# Patient Record
Sex: Female | Born: 1975 | Race: White | Hispanic: No | Marital: Married | Smoking: Former smoker
Health system: Southern US, Community
[De-identification: ages and names within clinical notes are randomized; demographics above are authoritative.]

## PROBLEM LIST (undated history)

## (undated) DIAGNOSIS — F329 Major depressive disorder, single episode, unspecified: Secondary | ICD-10-CM

## (undated) DIAGNOSIS — F32A Depression, unspecified: Secondary | ICD-10-CM

## (undated) DIAGNOSIS — F419 Anxiety disorder, unspecified: Secondary | ICD-10-CM

## (undated) DIAGNOSIS — K219 Gastro-esophageal reflux disease without esophagitis: Secondary | ICD-10-CM

## (undated) HISTORY — DX: Gastro-esophageal reflux disease without esophagitis: K21.9

## (undated) HISTORY — PX: TUBAL LIGATION: SHX77

---

## 2001-03-02 ENCOUNTER — Emergency Department (HOSPITAL_COMMUNITY): Admission: EM | Admit: 2001-03-02 | Discharge: 2001-03-03 | Payer: Self-pay | Admitting: *Deleted

## 2002-12-08 ENCOUNTER — Emergency Department (HOSPITAL_COMMUNITY): Admission: EM | Admit: 2002-12-08 | Discharge: 2002-12-08 | Payer: Self-pay | Admitting: Emergency Medicine

## 2003-03-10 ENCOUNTER — Emergency Department (HOSPITAL_COMMUNITY): Admission: EM | Admit: 2003-03-10 | Discharge: 2003-03-10 | Payer: Self-pay | Admitting: Emergency Medicine

## 2003-03-10 ENCOUNTER — Encounter: Payer: Self-pay | Admitting: *Deleted

## 2004-08-12 ENCOUNTER — Emergency Department (HOSPITAL_COMMUNITY): Admission: EM | Admit: 2004-08-12 | Discharge: 2004-08-12 | Payer: Self-pay | Admitting: Emergency Medicine

## 2004-11-24 ENCOUNTER — Emergency Department (HOSPITAL_COMMUNITY): Admission: EM | Admit: 2004-11-24 | Discharge: 2004-11-24 | Payer: Self-pay | Admitting: Emergency Medicine

## 2005-02-23 ENCOUNTER — Emergency Department (HOSPITAL_COMMUNITY): Admission: EM | Admit: 2005-02-23 | Discharge: 2005-02-23 | Payer: Self-pay | Admitting: Emergency Medicine

## 2005-06-02 ENCOUNTER — Emergency Department (HOSPITAL_COMMUNITY): Admission: EM | Admit: 2005-06-02 | Discharge: 2005-06-02 | Payer: Self-pay | Admitting: Emergency Medicine

## 2006-01-14 ENCOUNTER — Emergency Department (HOSPITAL_COMMUNITY): Admission: EM | Admit: 2006-01-14 | Discharge: 2006-01-14 | Payer: Self-pay | Admitting: Emergency Medicine

## 2006-06-02 ENCOUNTER — Emergency Department (HOSPITAL_COMMUNITY): Admission: EM | Admit: 2006-06-02 | Discharge: 2006-06-02 | Payer: Self-pay | Admitting: Emergency Medicine

## 2006-10-23 ENCOUNTER — Emergency Department (HOSPITAL_COMMUNITY): Admission: EM | Admit: 2006-10-23 | Discharge: 2006-10-23 | Payer: Self-pay | Admitting: Emergency Medicine

## 2006-11-12 ENCOUNTER — Emergency Department (HOSPITAL_COMMUNITY): Admission: EM | Admit: 2006-11-12 | Discharge: 2006-11-13 | Payer: Self-pay | Admitting: Emergency Medicine

## 2006-12-26 ENCOUNTER — Emergency Department (HOSPITAL_COMMUNITY): Admission: EM | Admit: 2006-12-26 | Discharge: 2006-12-27 | Payer: Self-pay | Admitting: Emergency Medicine

## 2007-01-17 ENCOUNTER — Emergency Department (HOSPITAL_COMMUNITY): Admission: EM | Admit: 2007-01-17 | Discharge: 2007-01-17 | Payer: Self-pay | Admitting: Emergency Medicine

## 2007-01-23 ENCOUNTER — Emergency Department (HOSPITAL_COMMUNITY): Admission: EM | Admit: 2007-01-23 | Discharge: 2007-01-23 | Payer: Self-pay | Admitting: Emergency Medicine

## 2007-07-05 ENCOUNTER — Emergency Department (HOSPITAL_COMMUNITY): Admission: EM | Admit: 2007-07-05 | Discharge: 2007-07-06 | Payer: Self-pay | Admitting: Emergency Medicine

## 2007-08-09 ENCOUNTER — Emergency Department (HOSPITAL_COMMUNITY): Admission: EM | Admit: 2007-08-09 | Discharge: 2007-08-09 | Payer: Self-pay | Admitting: Emergency Medicine

## 2007-08-31 ENCOUNTER — Emergency Department (HOSPITAL_COMMUNITY): Admission: EM | Admit: 2007-08-31 | Discharge: 2007-08-31 | Payer: Self-pay | Admitting: Emergency Medicine

## 2007-09-30 ENCOUNTER — Emergency Department (HOSPITAL_COMMUNITY): Admission: EM | Admit: 2007-09-30 | Discharge: 2007-09-30 | Payer: Self-pay | Admitting: Internal Medicine

## 2007-10-04 ENCOUNTER — Emergency Department (HOSPITAL_COMMUNITY): Admission: EM | Admit: 2007-10-04 | Discharge: 2007-10-04 | Payer: Self-pay | Admitting: Emergency Medicine

## 2007-10-31 ENCOUNTER — Emergency Department (HOSPITAL_COMMUNITY): Admission: EM | Admit: 2007-10-31 | Discharge: 2007-11-01 | Payer: Self-pay | Admitting: Emergency Medicine

## 2008-03-21 ENCOUNTER — Emergency Department (HOSPITAL_COMMUNITY): Admission: EM | Admit: 2008-03-21 | Discharge: 2008-03-21 | Payer: Self-pay | Admitting: Emergency Medicine

## 2008-05-04 ENCOUNTER — Emergency Department (HOSPITAL_COMMUNITY): Admission: EM | Admit: 2008-05-04 | Discharge: 2008-05-05 | Payer: Self-pay | Admitting: Emergency Medicine

## 2008-05-27 ENCOUNTER — Inpatient Hospital Stay (HOSPITAL_COMMUNITY): Admission: EM | Admit: 2008-05-27 | Discharge: 2008-05-30 | Payer: Self-pay | Admitting: Emergency Medicine

## 2008-06-14 ENCOUNTER — Emergency Department (HOSPITAL_COMMUNITY): Admission: EM | Admit: 2008-06-14 | Discharge: 2008-06-14 | Payer: Self-pay | Admitting: Emergency Medicine

## 2008-08-25 ENCOUNTER — Emergency Department (HOSPITAL_COMMUNITY): Admission: EM | Admit: 2008-08-25 | Discharge: 2008-08-25 | Payer: Self-pay | Admitting: Emergency Medicine

## 2008-08-26 ENCOUNTER — Inpatient Hospital Stay (HOSPITAL_COMMUNITY): Admission: EM | Admit: 2008-08-26 | Discharge: 2008-09-11 | Payer: Self-pay | Admitting: Psychiatry

## 2008-08-26 ENCOUNTER — Ambulatory Visit: Payer: Self-pay | Admitting: Psychiatry

## 2008-11-12 ENCOUNTER — Emergency Department (HOSPITAL_COMMUNITY): Admission: EM | Admit: 2008-11-12 | Discharge: 2008-11-14 | Payer: Self-pay | Admitting: Emergency Medicine

## 2008-11-13 ENCOUNTER — Ambulatory Visit: Payer: Self-pay | Admitting: Psychiatry

## 2008-11-14 ENCOUNTER — Inpatient Hospital Stay (HOSPITAL_COMMUNITY): Admission: AD | Admit: 2008-11-14 | Discharge: 2008-11-17 | Payer: Self-pay | Admitting: Psychiatry

## 2009-03-09 ENCOUNTER — Emergency Department (HOSPITAL_COMMUNITY): Admission: EM | Admit: 2009-03-09 | Discharge: 2009-03-09 | Payer: Self-pay | Admitting: Emergency Medicine
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
## 2010-10-12 LAB — DIFFERENTIAL
Eosinophils Relative: 1 % (ref 0–5)
Lymphocytes Relative: 29 % (ref 12–46)
Monocytes Relative: 8 % (ref 3–12)
Neutrophils Relative %: 61 % (ref 43–77)

## 2010-10-12 LAB — URINALYSIS, ROUTINE W REFLEX MICROSCOPIC
Bilirubin Urine: NEGATIVE
Glucose, UA: NEGATIVE mg/dL
Hgb urine dipstick: NEGATIVE
Nitrite: NEGATIVE
Specific Gravity, Urine: 1.01 (ref 1.005–1.030)
Urobilinogen, UA: 0.2 mg/dL (ref 0.0–1.0)
pH: 7 (ref 5.0–8.0)

## 2010-10-12 LAB — GC/CHLAMYDIA PROBE AMP, GENITAL
Chlamydia, DNA Probe: NEGATIVE
GC Probe Amp, Genital: NEGATIVE

## 2010-10-12 LAB — PREGNANCY, URINE: Preg Test, Ur: NEGATIVE

## 2010-10-12 LAB — WET PREP, GENITAL: Clue Cells Wet Prep HPF POC: NONE SEEN

## 2010-10-12 LAB — CBC
Hemoglobin: 13.8 g/dL (ref 12.0–15.0)
MCHC: 34.3 g/dL (ref 30.0–36.0)
MCV: 96.4 fL (ref 78.0–100.0)
RBC: 4.16 MIL/uL (ref 3.87–5.11)
RDW: 13 % (ref 11.5–15.5)
WBC: 7.8 10*3/uL (ref 4.0–10.5)

## 2010-10-12 LAB — BASIC METABOLIC PANEL
Chloride: 110 mEq/L (ref 96–112)
GFR calc Af Amer: >60 mL/min (ref >60)
Glucose, Bld: 90 mg/dL (ref 70–99)
Sodium: 139 mEq/L (ref 135–145)

## 2010-10-16 LAB — RAPID URINE DRUG SCREEN, HOSP PERFORMED
Amphetamines: NOT DETECTED
Opiates: POSITIVE — AB
Tetrahydrocannabinol: POSITIVE — AB

## 2010-10-16 LAB — BASIC METABOLIC PANEL
CO2: 27 mEq/L (ref 19–32)
Chloride: 101 mEq/L (ref 96–112)
GFR calc Af Amer: >60 mL/min (ref >60)
GFR calc non Af Amer: >60 mL/min (ref >60)
Glucose, Bld: 108 mg/dL — ABNORMAL HIGH (ref 70–99)
Potassium: 3.4 mEq/L — ABNORMAL LOW (ref 3.5–5.1)
Sodium: 134 mEq/L — ABNORMAL LOW (ref 135–145)

## 2010-10-16 LAB — CBC
Hemoglobin: 13.2 g/dL (ref 12.0–15.0)
MCHC: 34.1 g/dL (ref 30.0–36.0)
MCV: 97.4 fL (ref 78.0–100.0)
RDW: 13.8 % (ref 11.5–15.5)

## 2010-10-16 LAB — RPR: RPR Ser Ql: NONREACTIVE

## 2010-10-16 LAB — DIFFERENTIAL
Eosinophils Absolute: 0 10*3/uL (ref 0.0–0.7)
Eosinophils Relative: 0 % (ref 0–5)
Lymphocytes Relative: 25 % (ref 12–46)
Lymphs Abs: 2.7 10*3/uL (ref 0.7–4.0)
Monocytes Absolute: 0.9 10*3/uL (ref 0.1–1.0)
Monocytes Relative: 9 % (ref 3–12)
Neutro Abs: 7.3 10*3/uL (ref 1.7–7.7)

## 2010-10-16 LAB — HEPATIC FUNCTION PANEL
Albumin: 3.7 g/dL (ref 3.5–5.2)
Alkaline Phosphatase: 71 U/L (ref 39–117)
Total Bilirubin: 0.3 mg/dL (ref 0.3–1.2)

## 2010-10-23 LAB — BASIC METABOLIC PANEL
BUN: 4 mg/dL — ABNORMAL LOW (ref 6–23)
CO2: 26 mEq/L (ref 19–32)
GFR calc non Af Amer: >60 mL/min (ref >60)
Glucose, Bld: 103 mg/dL — ABNORMAL HIGH (ref 70–99)
Potassium: 3.2 mEq/L — ABNORMAL LOW (ref 3.5–5.1)

## 2010-10-23 LAB — CBC
HCT: 38 % (ref 36.0–46.0)
MCHC: 34.3 g/dL (ref 30.0–36.0)
MCV: 95.7 fL (ref 78.0–100.0)
Platelets: 343 10*3/uL (ref 150–400)
RDW: 13.3 % (ref 11.5–15.5)

## 2010-10-23 LAB — RAPID URINE DRUG SCREEN, HOSP PERFORMED: Tetrahydrocannabinol: POSITIVE — AB

## 2010-10-23 LAB — URINE MICROSCOPIC-ADD ON

## 2010-10-23 LAB — URINALYSIS, ROUTINE W REFLEX MICROSCOPIC
Leukocytes, UA: NEGATIVE
Nitrite: POSITIVE — AB
Specific Gravity, Urine: 1.01 (ref 1.005–1.030)
Urobilinogen, UA: 0.2 mg/dL (ref 0.0–1.0)
pH: 6.5 (ref 5.0–8.0)

## 2010-10-23 LAB — DIFFERENTIAL
Basophils Absolute: 0.1 10*3/uL (ref 0.0–0.1)
Basophils Relative: 1 % (ref 0–1)
Eosinophils Absolute: 0 10*3/uL (ref 0.0–0.7)
Eosinophils Relative: 0 % (ref 0–5)
Lymphocytes Relative: 28 % (ref 12–46)

## 2010-10-23 LAB — PREGNANCY, URINE: Preg Test, Ur: NEGATIVE

## 2010-10-23 LAB — PHENOBARBITAL LEVEL: Phenobarbital: <5 ug/mL — ABNORMAL LOW (ref 15.0–40.0)
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
## 2011-03-29 LAB — DIFFERENTIAL
Basophils Absolute: 0
Eosinophils Relative: 1
Lymphocytes Relative: 29
Lymphs Abs: 2
Neutro Abs: 4.4
Neutrophils Relative %: 62

## 2011-03-29 LAB — BASIC METABOLIC PANEL
BUN: 8
Calcium: 9.1
Creatinine, Ser: 0.6
GFR calc non Af Amer: >60
Glucose, Bld: 105 — ABNORMAL HIGH
Potassium: 3.4 — ABNORMAL LOW

## 2011-03-29 LAB — URINALYSIS, ROUTINE W REFLEX MICROSCOPIC
Bilirubin Urine: NEGATIVE
Glucose, UA: NEGATIVE
Hgb urine dipstick: NEGATIVE
Ketones, ur: NEGATIVE
Nitrite: NEGATIVE
Protein, ur: NEGATIVE
Urobilinogen, UA: 0.2
pH: 6.5
pH: 6.5

## 2011-03-29 LAB — URINE MICROSCOPIC-ADD ON

## 2011-03-29 LAB — CBC
HCT: 36.3
Platelets: 266
RDW: 13.4
WBC: 7

## 2011-03-29 LAB — PREGNANCY, URINE: Preg Test, Ur: NEGATIVE

## 2011-04-01 LAB — URINALYSIS, ROUTINE W REFLEX MICROSCOPIC
Bilirubin Urine: NEGATIVE
Nitrite: NEGATIVE
Specific Gravity, Urine: <1.005 — ABNORMAL LOW
Urobilinogen, UA: 0.2
pH: 7.5

## 2011-04-01 LAB — PREGNANCY, URINE: Preg Test, Ur: NEGATIVE

## 2011-04-02 LAB — RAPID URINE DRUG SCREEN, HOSP PERFORMED
Amphetamines: NOT DETECTED
Barbiturates: NOT DETECTED
Cocaine: NOT DETECTED
Opiates: NOT DETECTED

## 2011-04-02 LAB — DIFFERENTIAL
Basophils Absolute: 0
Basophils Relative: 1
Lymphocytes Relative: 46
Neutro Abs: 4.1
Neutrophils Relative %: 47

## 2011-04-02 LAB — URINALYSIS, ROUTINE W REFLEX MICROSCOPIC
Nitrite: NEGATIVE
Specific Gravity, Urine: <1.005 — ABNORMAL LOW
Urobilinogen, UA: 0.2
pH: 5.5

## 2011-04-02 LAB — BASIC METABOLIC PANEL
CO2: 20
Calcium: 9.2
Creatinine, Ser: 0.59
GFR calc Af Amer: >60
GFR calc non Af Amer: >60
Sodium: 141

## 2011-04-02 LAB — CBC
MCHC: 34.9
RBC: 4.02
RDW: 13.6

## 2011-04-08 LAB — URINE CULTURE: Colony Count: >=100000

## 2011-04-08 LAB — URINALYSIS, ROUTINE W REFLEX MICROSCOPIC
Bilirubin Urine: NEGATIVE
Glucose, UA: NEGATIVE
Hgb urine dipstick: NEGATIVE
Ketones, ur: NEGATIVE
Nitrite: NEGATIVE
Protein, ur: NEGATIVE
Specific Gravity, Urine: 1.01
Urobilinogen, UA: 0.2
pH: 7

## 2011-04-08 LAB — RAPID URINE DRUG SCREEN, HOSP PERFORMED
Amphetamines: NOT DETECTED
Barbiturates: NOT DETECTED
Benzodiazepines: POSITIVE — AB
Cocaine: POSITIVE — AB
Opiates: POSITIVE — AB
Tetrahydrocannabinol: POSITIVE — AB

## 2011-04-08 LAB — URINE MICROSCOPIC-ADD ON

## 2011-04-08 LAB — PREGNANCY, URINE: Preg Test, Ur: NEGATIVE

## 2011-04-09 LAB — PROTIME-INR
INR: 1
Prothrombin Time: 13.2

## 2011-04-09 LAB — POCT I-STAT, CHEM 8
Calcium, Ion: 1.13
Chloride: 109
Glucose, Bld: 92
HCT: 40
TCO2: 22

## 2011-04-09 LAB — CBC
Hemoglobin: 13.4
MCHC: 33.2
Platelets: 322
RDW: 13.7

## 2011-04-23 LAB — DIFFERENTIAL
Basophils Absolute: 0
Lymphocytes Relative: 29
Neutro Abs: 5.8

## 2011-04-23 LAB — URINALYSIS, ROUTINE W REFLEX MICROSCOPIC
Nitrite: NEGATIVE
Specific Gravity, Urine: 1.01
Urobilinogen, UA: 0.2

## 2011-04-23 LAB — I-STAT 8, (EC8 V) (CONVERTED LAB)
Acid-base deficit: 3 — ABNORMAL HIGH
Bicarbonate: 20.3
Chloride: 108
pCO2, Ven: 30.7 — ABNORMAL LOW
pH, Ven: 7.43 — ABNORMAL HIGH

## 2011-04-23 LAB — CBC
HCT: 41.6
MCV: 95.4
Platelets: 341
RBC: 4.36
WBC: 9.1

## 2011-04-23 LAB — COMPREHENSIVE METABOLIC PANEL
BUN: 4 — ABNORMAL LOW
CO2: 23
Chloride: 107
Creatinine, Ser: 0.56
GFR calc non Af Amer: >60
Total Bilirubin: 0.5

## 2011-04-23 LAB — URINE CULTURE

## 2011-04-23 LAB — URINE MICROSCOPIC-ADD ON

## 2011-04-23 LAB — LIPASE, BLOOD: Lipase: 26

## 2011-04-23 LAB — POCT PREGNANCY, URINE: Preg Test, Ur: NEGATIVE

## 2011-04-24 LAB — BASIC METABOLIC PANEL
Calcium: 8.7
Creatinine, Ser: 0.62
GFR calc non Af Amer: >60
Glucose, Bld: 80
Sodium: 136

## 2011-04-24 LAB — URINE MICROSCOPIC-ADD ON

## 2011-04-24 LAB — DIFFERENTIAL
Basophils Absolute: 0.1
Lymphocytes Relative: 19
Monocytes Absolute: 1 — ABNORMAL HIGH
Neutro Abs: 7
Neutrophils Relative %: 70

## 2011-04-24 LAB — CBC
Hemoglobin: 13.3
Platelets: 289
RDW: 13.6

## 2011-04-24 LAB — URINALYSIS, ROUTINE W REFLEX MICROSCOPIC
Nitrite: POSITIVE — AB
Specific Gravity, Urine: 1.025
Urobilinogen, UA: 0.2
pH: 6

## 2011-07-01 ENCOUNTER — Encounter: Payer: Self-pay | Admitting: *Deleted

## 2011-07-01 ENCOUNTER — Emergency Department (HOSPITAL_COMMUNITY)
Admission: EM | Admit: 2011-07-01 | Discharge: 2011-07-01 | Disposition: A | Discharge status: Home / Self Care | Payer: Self-pay | Attending: Emergency Medicine | Admitting: Emergency Medicine

## 2011-07-01 DIAGNOSIS — G479 Sleep disorder, unspecified: Secondary | ICD-10-CM | POA: Insufficient documentation

## 2011-07-01 DIAGNOSIS — H16009 Unspecified corneal ulcer, unspecified eye: Secondary | ICD-10-CM | POA: Insufficient documentation

## 2011-07-01 DIAGNOSIS — F411 Generalized anxiety disorder: Secondary | ICD-10-CM | POA: Insufficient documentation

## 2011-07-01 DIAGNOSIS — Z79899 Other long term (current) drug therapy: Secondary | ICD-10-CM | POA: Insufficient documentation

## 2011-07-01 DIAGNOSIS — R0602 Shortness of breath: Secondary | ICD-10-CM | POA: Insufficient documentation

## 2011-07-01 DIAGNOSIS — F419 Anxiety disorder, unspecified: Secondary | ICD-10-CM

## 2011-07-01 MED ORDER — ALPRAZOLAM 0.25 MG PO TABS: 0.2500 mg | ORAL_TABLET | Freq: Three times a day (TID) | ORAL | Status: AC | PRN

## 2011-07-01 MED ORDER — HYDROCODONE-ACETAMINOPHEN 5-325 MG PO TABS: 1.0000 | ORAL_TABLET | ORAL | Status: AC | PRN

## 2011-07-01 MED ORDER — ALPRAZOLAM 0.5 MG PO TABS
0.2500 mg | ORAL_TABLET | Freq: Once | ORAL | Status: AC
  Administered 2011-07-01: 0.25 mg via ORAL
  Filled 2011-07-01: qty 1

## 2011-07-01 MED ORDER — FLUORESCEIN SODIUM 1 MG OP STRP
1.0000 | ORAL_STRIP | Freq: Once | OPHTHALMIC | Status: AC
Start: 2011-07-01 — End: 2011-07-01
  Administered 2011-07-01: 1 via OPHTHALMIC
  Filled 2011-07-01: qty 1

## 2011-07-01 MED ORDER — TETRACAINE HCL 0.5 % OP SOLN
2.0000 [drp] | Freq: Once | OPHTHALMIC | Status: AC
  Administered 2011-07-01: 2 [drp] via OPHTHALMIC
  Filled 2011-07-01: qty 2

## 2011-07-01 MED ORDER — CIPROFLOXACIN HCL 0.3 % OP SOLN: OPHTHALMIC | Status: DC

## 2011-07-01 NOTE — ED Notes (Signed)
Pt states she is having a panic attack with cp and sob pt also states she feels like her muscles are falling off her body.

## 2011-07-01 NOTE — ED Notes (Addendum)
Pt upset because her and her husband separated after 21 yrs. He left her 2 months behind on the bills and has lost 20lbs. Pt feels sob, like she is having a panic attack. Pt states she had a panic attack at work and had her cousin bring her here. Pt also c/o left eye pain.

## 2011-07-02 NOTE — ED Provider Notes (Signed)
History     CSN: [id number]  Arrival date & time 07/01/11  4098   First MD Initiated Contact with Patient 07/01/11 [phone]      Chief Complaint  Patient presents with  . Shortness of Breath  . Panic Attack  . Eye Problem    (Consider location/radiation/quality/duration/timing/severity/associated sxs/prior treatment) Patient is a 35 y.o. female presenting with eye problem and anxiety. The history is provided by the patient.  Eye Problem  This is a new problem. The current episode started yesterday. The problem occurs constantly. The problem has not changed since onset.There is pain in the left eye. The injury mechanism was contact lenses. The pain is at a severity of 9/10. The pain is moderate. There is no history of trauma to the eye. There is no known exposure to pink eye. She wears contacts. Associated symptoms include photophobia and eye redness. Pertinent negatives include no numbness, no decreased vision, no discharge, no foreign body sensation, no nausea, no vomiting and no weakness. Associated symptoms comments: She took her contacts out yesterday when her eye started getting red and irritated.. She has tried nothing for the symptoms.  Anxiety This is a new problem. The current episode started today. The problem occurs every several days. The problem has been unchanged. Pertinent negatives include no abdominal pain, arthralgias, chest pain, congestion, fever, headaches, joint swelling, nausea, neck pain, numbness, rash, sore throat, vomiting or weakness. Associated symptoms comments: She reports decreased sleep and appetite,  With increasing anxiety and several escalating episodes of anxiety and panic attack since she and her husband separated last month.  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  She became sob,  Hyperventilated and could not stop crying.  Her cousin brought her in here for assistance.  She denies homicidal and suicidal ideation. .  She has tried nothing (She and husband attended 2 marriage counseling sessions,  but he was unwilling to cooperate.) for the symptoms.    History reviewed. No pertinent past medical history.  Past Surgical History  Procedure Date  . Tubal ligation     No family history on file.  History  Substance Use Topics  . Smoking status: Not on file  . Smokeless tobacco: Not on file  . Alcohol Use:     OB History    Grav Para Term Preterm Abortions TAB SAB Ect Mult Living                  Review of Systems  Constitutional: Negative for fever.  HENT: Negative for congestion, sore throat and neck pain.   Eyes: Positive for photophobia, pain, redness and visual disturbance. Negative for discharge and itching.  Respiratory: Negative for chest tightness and shortness of breath.   Cardiovascular: Negative for chest pain.  Gastrointestinal: Negative for nausea, vomiting and abdominal pain.  Genitourinary: Negative.   Musculoskeletal: Negative for joint swelling and arthralgias.  Skin: Negative.  Negative for rash and wound.  Neurological: Negative for dizziness, weakness, light-headedness, numbness and headaches.  Hematological: Negative.   Psychiatric/Behavioral: Positive for sleep disturbance. Negative for suicidal ideas. The patient is nervous/anxious.     Allergies  Review of patient's allergies indicates no known allergies.  Home Medications   Current Outpatient Rx  Name Route Sig Dispense Refill  . ALPRAZOLAM 0.25 MG PO TABS Oral Take 1 tablet (0.25 mg total) by mouth 3 (three) times daily as needed for anxiety. 30 tablet 0  . CIPROFLOXACIN HCL  0.3 % OP SOLN  Administer 2 drops every 15 minutes for the first 6 hours,  Then 2 drops every 30 minutes for the rest of today.  2 drops every hour tomorrow,  Then 2 drops every 4 hours for remaining 12 days. 10 mL 0  . HYDROCODONE-ACETAMINOPHEN 5-325 MG PO TABS Oral Take 1 tablet by mouth every 4 (four) hours as needed for pain. 15 tablet  0    BP 91/65  Pulse 67  Temp 97.7 F (36.5 C)  Resp 18  Ht 5\' 2"  (1.575 m)  Wt 100 lb (45.36 kg)  BMI 18.29 kg/m2  SpO2 98%  LMP 06/23/2011  Physical Exam  Nursing note and vitals reviewed. Constitutional: She is oriented to person, place, and time. She appears well-developed and well-nourished.  HENT:  Head: Normocephalic and atraumatic.  Eyes: Left eye exhibits no chemosis, no discharge and no exudate. No foreign body present in the left eye. Left conjunctiva is injected. Left eye exhibits normal extraocular motion and no nystagmus.  Fundoscopic exam:      The left eye shows red reflex. Slit lamp exam:      The left eye shows corneal ulcer and fluorescein uptake. The left eye shows no foreign body.         Visual acuity 20/13 os,  20/70 od.  Neck: Normal range of motion.  Cardiovascular: Normal rate, regular rhythm, normal heart sounds and intact distal pulses.   Pulmonary/Chest: Effort normal and breath sounds normal. She has no wheezes.  Abdominal: Soft. Bowel sounds are normal. There is no tenderness.  Musculoskeletal: Normal range of motion.  Neurological: She is alert and oriented to person, place, and time.  Skin: Skin is warm and dry.  Psychiatric: Her speech is normal and behavior is normal. Thought content normal. Her mood appears anxious.       Tearful and sad without flat affect.  Denies suicidal ideation.    ED Course  Procedures (including critical care time)  Labs Reviewed - No data to display No results found.   1. Anxiety   2. Corneal ulcer       MDM  Spoke with Frances Maywood with [facility] who gave recommendations for personal counseling for pt.  Given small quantity of xanax for assistance with increased anxiety.   Prescribed cipro ophthalmic drops for right eye.  Advised to avoid contacts,  Pt has glasses, until rechecked by ophthalmology - referred to Dr. Lita Mains for recheck in 2 days.        Candis Musa, PA 07/02/11 2156

## 2011-07-03 NOTE — ED Provider Notes (Signed)
Medical screening examination/treatment/procedure(s) were performed by non-physician practitioner and as supervising physician I was immediately available for consultation/collaboration.   Shelda Jakes, MD 07/03/11 [phone]

## 2012-01-07 ENCOUNTER — Encounter (HOSPITAL_COMMUNITY): Payer: Self-pay | Admitting: *Deleted

## 2012-01-07 ENCOUNTER — Emergency Department (HOSPITAL_COMMUNITY)
Admission: EM | Admit: 2012-01-07 | Discharge: 2012-01-08 | Disposition: A | Discharge status: Home / Self Care | Payer: Medicaid Other | Attending: Emergency Medicine | Admitting: Emergency Medicine

## 2012-01-07 DIAGNOSIS — R109 Unspecified abdominal pain: Secondary | ICD-10-CM | POA: Insufficient documentation

## 2012-01-07 DIAGNOSIS — N949 Unspecified condition associated with female genital organs and menstrual cycle: Secondary | ICD-10-CM | POA: Insufficient documentation

## 2012-01-07 DIAGNOSIS — R102 Pelvic and perineal pain: Secondary | ICD-10-CM

## 2012-01-07 DIAGNOSIS — F17200 Nicotine dependence, unspecified, uncomplicated: Secondary | ICD-10-CM | POA: Insufficient documentation

## 2012-01-07 NOTE — ED Notes (Signed)
Pt reports severe cramping and discharge for several days.  Reports abnormal pap about 1 month ago.

## 2012-01-08 ENCOUNTER — Other Ambulatory Visit (HOSPITAL_COMMUNITY): Payer: Self-pay | Admitting: Emergency Medicine

## 2012-01-08 ENCOUNTER — Ambulatory Visit (HOSPITAL_COMMUNITY)
Admit: 2012-01-08 | Discharge: 2012-01-08 | Disposition: A | Discharge status: Home / Self Care | Payer: Medicaid Other | Attending: Emergency Medicine | Admitting: Emergency Medicine

## 2012-01-08 ENCOUNTER — Emergency Department (HOSPITAL_COMMUNITY): Payer: Medicaid Other

## 2012-01-08 DIAGNOSIS — N949 Unspecified condition associated with female genital organs and menstrual cycle: Secondary | ICD-10-CM | POA: Insufficient documentation

## 2012-01-08 DIAGNOSIS — R229 Localized swelling, mass and lump, unspecified: Secondary | ICD-10-CM | POA: Insufficient documentation

## 2012-01-08 LAB — URINALYSIS, ROUTINE W REFLEX MICROSCOPIC
Glucose, UA: NEGATIVE mg/dL
Hgb urine dipstick: NEGATIVE
Leukocytes, UA: NEGATIVE
pH: 6 (ref 5.0–8.0)

## 2012-01-08 LAB — CBC WITH DIFFERENTIAL/PLATELET
Basophils Relative: 0 % (ref 0–1)
Hemoglobin: 13.6 g/dL (ref 12.0–15.0)
Lymphocytes Relative: 29 % (ref 12–46)
Lymphs Abs: 3 10*3/uL (ref 0.7–4.0)
MCHC: 33.7 g/dL (ref 30.0–36.0)
Monocytes Relative: 8 % (ref 3–12)
Neutro Abs: 6.6 10*3/uL (ref 1.7–7.7)
Neutrophils Relative %: 63 % (ref 43–77)
RBC: 4.27 MIL/uL (ref 3.87–5.11)
WBC: 10.6 10*3/uL — ABNORMAL HIGH (ref 4.0–10.5)

## 2012-01-08 LAB — PREGNANCY, URINE: Preg Test, Ur: NEGATIVE

## 2012-01-08 LAB — BASIC METABOLIC PANEL
BUN: 5 mg/dL — ABNORMAL LOW (ref 6–23)
CO2: 22 mEq/L (ref 19–32)
Chloride: 104 mEq/L (ref 96–112)
GFR calc Af Amer: >90 mL/min (ref >90)
Potassium: 3 mEq/L — ABNORMAL LOW (ref 3.5–5.1)

## 2012-01-08 LAB — WET PREP, GENITAL
Trich, Wet Prep: NONE SEEN
Yeast Wet Prep HPF POC: NONE SEEN

## 2012-01-08 MED ORDER — IOHEXOL 300 MG/ML  SOLN
100.0000 mL | Freq: Once | INTRAMUSCULAR | Status: AC | PRN
  Administered 2012-01-08: 100 mL via INTRAVENOUS

## 2012-01-08 MED ORDER — FENTANYL CITRATE 0.05 MG/ML IJ SOLN
100.0000 ug | Freq: Once | INTRAMUSCULAR | Status: AC
  Administered 2012-01-08: 100 ug via INTRAVENOUS
  Filled 2012-01-08: qty 2

## 2012-01-08 MED ORDER — SODIUM CHLORIDE 0.9 % IV SOLN
INTRAVENOUS | Status: DC
  Administered 2012-01-08: 01:00:00 via INTRAVENOUS

## 2012-01-08 MED ORDER — HYDROMORPHONE HCL PF 1 MG/ML IJ SOLN
1.0000 mg | Freq: Once | INTRAMUSCULAR | Status: AC
  Administered 2012-01-08: 1 mg via INTRAVENOUS
  Filled 2012-01-08 (×2): qty 1

## 2012-01-08 MED ORDER — HYDROMORPHONE HCL 2 MG PO TABS: 2.0000 mg | ORAL_TABLET | ORAL | Status: AC | PRN

## 2012-01-08 NOTE — ED Provider Notes (Signed)
History     CSN: [id number]  Arrival date & time 01/07/12  2245   First MD Initiated Contact with Patient 01/08/12 [phone]      Chief Complaint  Patient presents with  . Abdominal cramping     (Consider location/radiation/quality/duration/timing/severity/associated sxs/prior treatment) HPI This is a 36 year old white female with a three-day history of pelvic cramping. She states the cramping is moderate to severe. It is been accompanied by some nausea but no vomiting or diarrhea. She denies dysuria, hematuria or fever. She has had a vaginal discharge. She has had some chills. She has had a diminished appetite. She rates the pain as an 8/10. The pain is worse with movement or palpation. She had an abnormal Pap smear about 5 weeks ago but has not followup for colposcopy.  History reviewed. No pertinent past medical history.  Past Surgical History  Procedure Date  . Tubal ligation     History reviewed. No pertinent family history.  History  Substance Use Topics  . Smoking status: Current Everyday Smoker  . Smokeless tobacco: Not on file  . Alcohol Use: No    OB History    Grav Para Term Preterm Abortions TAB SAB Ect Mult Living                  Review of Systems  All other systems reviewed and are negative.    Allergies  Review of patient's allergies indicates no known allergies.  Home Medications   Current Outpatient Rx  Name Route Sig Dispense Refill  . CIPROFLOXACIN HCL 0.3 % OP SOLN  Administer 2 drops every 15 minutes for the first 6 hours,  Then 2 drops every 30 minutes for the rest of today.  2 drops every hour tomorrow,  Then 2 drops every 4 hours for remaining 12 days. 10 mL 0    BP 106/79  Pulse 65  Temp 98.9 F (37.2 C)  Resp 18  Ht 5\' 2"  (1.575 m)  Wt 132 lb (59.875 kg)  BMI 24.14 kg/m2  SpO2 98%  LMP 12/14/2011  Physical Exam General: Well-developed, well-nourished female in no acute distress; appearance consistent with age of record HENT:  normocephalic, atraumatic Eyes: pupils equal round and reactive to light; extraocular muscles intact Neck: supple Heart: regular rate and rhythm Lungs: clear to auscultation bilaterally Abdomen: soft; nondistended; lower abdominal tenderness, greatest in the left suprapubic region; no masses or hepatosplenomegaly; bowel sounds present GU: No flank tenderness; normal external genitalia; white vaginal discharge; cervix appears normal with closed os; cervical motion tenderness; bilateral adnexal tenderness Extremities: No deformity; full range of motion; pulses normal Neurologic: Awake, alert and oriented; motor function intact in all extremities and symmetric; no facial droop Skin: Warm and dry    ED Course  Procedures (including critical care time)    MDM   Nursing notes and vitals signs, including pulse oximetry, reviewed.  Summary of this visit's results, reviewed by myself:  Labs:  Results for orders placed during the hospital encounter of 01/07/12  URINALYSIS, ROUTINE W REFLEX MICROSCOPIC      Component Value Range   Color, Urine AMBER (*) YELLOW   APPearance CLEAR  CLEAR   Specific Gravity, Urine 1.015  1.005 - 1.030   pH 6.0  5.0 - 8.0   Glucose, UA NEGATIVE  NEGATIVE mg/dL   Hgb urine dipstick NEGATIVE  NEGATIVE   Bilirubin Urine NEGATIVE  NEGATIVE   Ketones, ur NEGATIVE  NEGATIVE mg/dL   Protein, ur NEGATIVE  NEGATIVE mg/dL  Urobilinogen, UA 0.2  0.0 - 1.0 mg/dL   Nitrite NEGATIVE  NEGATIVE   Leukocytes, UA NEGATIVE  NEGATIVE  PREGNANCY, URINE      Component Value Range   Preg Test, Ur NEGATIVE  NEGATIVE  CBC WITH DIFFERENTIAL      Component Value Range   WBC 10.6 (*) 4.0 - 10.5 K/uL   RBC 4.27  3.87 - 5.11 MIL/uL   Hemoglobin 13.6  12.0 - 15.0 g/dL   HCT 29.5  62.1 - 30.8 %   MCV 94.4  78.0 - 100.0 fL   MCH 31.9  26.0 - 34.0 pg   MCHC 33.7  30.0 - 36.0 g/dL   RDW 65.7  84.6 - 96.2 %   Platelets 269  150 - 400 K/uL   Neutrophils Relative 63  43 - 77 %    Neutro Abs 6.6  1.7 - 7.7 K/uL   Lymphocytes Relative 29  12 - 46 %   Lymphs Abs 3.0  0.7 - 4.0 K/uL   Monocytes Relative 8  3 - 12 %   Monocytes Absolute 0.9  0.1 - 1.0 K/uL   Eosinophils Relative 0  0 - 5 %   Eosinophils Absolute 0.0  0.0 - 0.7 K/uL   Basophils Relative 0  0 - 1 %   Basophils Absolute 0.0  0.0 - 0.1 K/uL  BASIC METABOLIC PANEL      Component Value Range   Sodium 138  135 - 145 mEq/L   Potassium 3.0 (*) 3.5 - 5.1 mEq/L   Chloride 104  96 - 112 mEq/L   CO2 22  19 - 32 mEq/L   Glucose, Bld 108 (*) 70 - 99 mg/dL   BUN 5 (*) 6 - 23 mg/dL   Creatinine, Ser 9.52  0.50 - 1.10 mg/dL   Calcium 9.3  8.4 - 84.1 mg/dL   GFR calc non Af Amer >90  >90 mL/min   GFR calc Af Amer >90  >90 mL/min  WET PREP, GENITAL      Component Value Range   Yeast Wet Prep HPF POC NONE SEEN  NONE SEEN   Trich, Wet Prep NONE SEEN  NONE SEEN   Clue Cells Wet Prep HPF POC NONE SEEN  NONE SEEN   WBC, Wet Prep HPF POC FEW (*) NONE SEEN    Imaging Studies: Ct Abdomen Pelvis W Contrast  01/08/2012  *RADIOLOGY REPORT*  Clinical Data: Abdominal pain  CT ABDOMEN AND PELVIS WITH CONTRAST  Technique:  Multidetector CT imaging of the abdomen and pelvis was performed following the standard protocol during bolus administration of intravenous contrast.  Contrast: OMNIPAQUE IOHEXOL 300 MG/ML  SOLN  Comparison: 03/09/2009  Findings: Limited images through the lung bases demonstrate no significant appreciable abnormality. The heart size is within normal limits. No pleural or pericardial effusion.  Tiny liver hypodensity.  Otherwise, unremarkable liver, spleen, pancreas, adrenal glands.  Extrarenal pelvis on the right.  Tiny hypodensity right lower pole. No hydronephrosis or hydroureter.  No bowel obstruction.  No CT evidence for colitis.  Normal appendix.  No free intraperitoneal air.  Partially decompressed bladder.  Corpus luteal cyst noted on the left. Question a mild fluid density tubular structure along the  right adnexa.  Right sided tubal ligation clip is present.  The left clip has become dislodged, and is located within the right lower quadrant.  Normal caliber vasculature.  No lymphadenopathy.  No acute osseous finding.  IMPRESSION: Mild fullness in the right adnexa, mild  hydrosalpinx not excluded. Consider ultrasound follow-up if clinically warranted.  No abscess. Trace free fluid.  Left tubal ligation clip is dislodged. This is unlikely to be symptomatic however the contraceptive effectiveness is uncertain.  Original Report Authenticated By: Waneta Martins, M.D.   3:46 AM We'll have patient return for pelvic ultrasound.         Hanley Seamen, MD 01/08/12 [phone]

## 2012-01-08 NOTE — ED Notes (Signed)
Pelvic exam completed.  Pt to be medicated for additional pain.

## 2012-01-08 NOTE — ED Provider Notes (Signed)
  Pt given [place] results. Advised to be rechecked at the [facility] where she goes or to [facility].    *RADIOLOGY REPORT*  Clinical Data: Possible right hydrosalpinx on CT  TRANSABDOMINAL AND TRANSVAGINAL ULTRASOUND OF PELVIS  Technique: Both transabdominal and transvaginal ultrasound  examinations of the pelvis were performed. Transabdominal technique  was performed for global imaging of the pelvis including uterus,  ovaries, adnexal regions, and pelvic cul-de-sac.  It was necessary to proceed with endovaginal exam following the  transabdominal exam to visualize the endometrium.  Comparison: None  Findings:  Uterus: Normal in size and appearance, measuring 8.7 x 5.0 x 5.0  cm.  Endometrium: Normal in thickness and appearance, measuring 9 mm.  Right ovary: Normal appearance/no adnexal mass, measuring 3.9 x  1.6 x 2.0 cm. No findings to suggest hydrosalpinx.  Left ovary: Normal appearance/no adnexal mass, measuring 4.3 x 2.0  x 3.1 cm. Associated 1.6 cm physiologic/hemorrhagic cyst.  Other findings: Small volume pelvic ascites.  IMPRESSION:  Normal study. No evidence of pelvic mass or other significant  abnormality.  Specifically, no evidence of hydrosalpinx to correspond to the  possible CT abnormality.  Original Report Authenticated By: Charline Bills, M.D.   Ward Givens, MD 01/08/12 [phone]

## 2012-01-08 NOTE — ED Notes (Signed)
Discussed pain management with pt.  Also discussed [place], time to be back for test. Pt verbalized understanding.

## 2012-01-08 NOTE — ED Notes (Signed)
Pt given CT contrast.

## 2012-02-25 ENCOUNTER — Encounter (HOSPITAL_COMMUNITY): Payer: Self-pay | Admitting: Emergency Medicine

## 2012-02-25 ENCOUNTER — Emergency Department (HOSPITAL_COMMUNITY)
Admission: EM | Admit: 2012-02-25 | Discharge: 2012-02-26 | Disposition: A | Discharge status: Home / Self Care | Payer: Medicaid Other | Attending: Emergency Medicine | Admitting: Emergency Medicine

## 2012-02-25 DIAGNOSIS — F17200 Nicotine dependence, unspecified, uncomplicated: Secondary | ICD-10-CM | POA: Insufficient documentation

## 2012-02-25 DIAGNOSIS — R102 Pelvic and perineal pain: Secondary | ICD-10-CM

## 2012-02-25 DIAGNOSIS — N949 Unspecified condition associated with female genital organs and menstrual cycle: Secondary | ICD-10-CM | POA: Insufficient documentation

## 2012-02-25 LAB — URINALYSIS, ROUTINE W REFLEX MICROSCOPIC
Glucose, UA: NEGATIVE mg/dL
Hgb urine dipstick: NEGATIVE
Ketones, ur: NEGATIVE mg/dL
Leukocytes, UA: NEGATIVE
Protein, ur: NEGATIVE mg/dL

## 2012-02-25 MED ORDER — HYDROCODONE-ACETAMINOPHEN 5-325 MG PO TABS
2.0000 | ORAL_TABLET | Freq: Once | ORAL | Status: AC
  Administered 2012-02-25: 2 via ORAL
  Filled 2012-02-25: qty 2

## 2012-02-25 MED ORDER — CEFIXIME 400 MG PO TABS
400.0000 mg | ORAL_TABLET | Freq: Once | ORAL | Status: AC
  Administered 2012-02-25: 400 mg via ORAL
  Filled 2012-02-25: qty 1

## 2012-02-25 MED ORDER — METRONIDAZOLE 500 MG PO TABS
2000.0000 mg | ORAL_TABLET | Freq: Once | ORAL | Status: AC
  Administered 2012-02-25: 2000 mg via ORAL
  Filled 2012-02-25: qty 4

## 2012-02-25 MED ORDER — IBUPROFEN 800 MG PO TABS
800.0000 mg | ORAL_TABLET | Freq: Once | ORAL | Status: AC
  Administered 2012-02-25: 800 mg via ORAL
  Filled 2012-02-25: qty 1

## 2012-02-25 NOTE — ED Provider Notes (Signed)
History  This chart was scribed for [employer]. Colon Branch, MD by Bennett Scrape. This patient was seen in room APA12/[facility]12 and the patient's care was started at 10:56PM.  CSN: [id number]  Arrival date & time 02/25/12  2050   First MD Initiated Contact with Patient 02/25/12 2256      Chief Complaint  Patient presents with  . Abdominal Pain     The history is provided by the patient. No language interpreter was used.    Elizabeth [name] is a 36 y.o. female who presents to the Emergency Department complaining of 3 days of gradually worsening, constant suprapubic abdominal pain described as sharp that radiates into the LLQ. The pain is worse going from a sitting to a standing position. She also reports passing dark black blood clots with urination when she is not menstruating. She reports that she has been experiencing the pain for the past 6 months. She states that [facility] and had an abnormal PAP smear. She was scheduled for a follow up visit when the pain became more severe. She states that she was seen in this ED when the pain increased, had another pelvic exam with increased pain and an [place] that she reports showed cysts on her ovaries. She was told to follow up with [facility] and had another pelvic exam. She states that she was put on megace and finished the medication 2 days ago. She states that she has a follow-up appointment with [facility] again on 03/02/12. She is here tonight for pain management until her next appointment. She denies fever, sore throat, visual disturbance, CP, SOB, nausea, emesis, diarrhea, urinary symptoms, back pain, HA, and rash as associated symptoms.  She does not have a h/o chronic medical conditions. She is a current everyday smoker and occasional alcohol user.  History reviewed. No pertinent past medical history.  Past Surgical History  Procedure Date  . Tubal ligation     History reviewed. No pertinent family history.  History    Substance Use Topics  . Smoking status: Current Everyday Smoker  . Smokeless tobacco: Not on file  . Alcohol Use: Yes     occ    No OB history provided.  Review of Systems  A complete 10 system review of systems was obtained and all systems are negative except as noted in the HPI and PMH.   Allergies  Review of patient's allergies indicates no known allergies.  Home Medications   Current Outpatient Rx  Name Route Sig Dispense Refill  . IBUPROFEN 200 MG PO TABS Oral Take 600 mg by mouth as needed.    . MEGESTROL ACETATE 40 MG PO TABS Oral Take 40 mg by mouth daily.    Marland Kitchen ZOLPIDEM TARTRATE 10 MG PO TABS Oral Take 10 mg by mouth at bedtime.      Triage Vitals: BP 100/67  Pulse 99  Temp 98.7 F (37.1 C) (Oral)  Resp 20  Ht 5' (1.524 m)  Wt 135 lb (61.236 kg)  BMI 26.37 kg/m2  SpO2 100%  LMP 01/13/2012  Physical Exam  Nursing note and vitals reviewed. Constitutional: She is oriented to person, place, and time. She appears well-developed and well-nourished. No distress.  HENT:  Head: Normocephalic and atraumatic.  Eyes: Conjunctivae and EOM are normal.  Neck: Neck supple. No tracheal deviation present.  Cardiovascular: Normal rate.   Pulmonary/Chest: Effort normal. No respiratory distress.  Abdominal: Soft. There is tenderness (LLQ tenderness wth a minor amount  of suprapubic tenderness ). There is rebound (mild) and guarding (mild).  Musculoskeletal: Normal range of motion. She exhibits no edema.  Neurological: She is alert and oriented to person, place, and time.  Skin: Skin is warm and dry.  Psychiatric: She has a normal mood and affect. Her behavior is normal.    ED Course  Procedures (including critical care time)  DIAGNOSTIC STUDIES: Oxygen Saturation is 100% on room air, normal by my interpretation.    COORDINATION OF CARE: 11:11PM-Reviewed pt's records from her last visit with her. Advised the pt that she could become pregnant due to the left tubal  ligation clip being dislodged. Pt stated that she wasn't worried about becoming pregnant.  11:15PM-Discussed discharge plan of antibiotics and pain medication with pt at bedside and pt agreed to plan. Advised pt to keep her follow-up appointment.  Results for orders placed during the hospital encounter of 02/25/12  URINALYSIS, ROUTINE W REFLEX MICROSCOPIC      Component Value Range   Color, Urine YELLOW  YELLOW   APPearance CLEAR  CLEAR   Specific Gravity, Urine 1.010  1.005 - 1.030   pH 6.0  5.0 - 8.0   Glucose, UA NEGATIVE  NEGATIVE mg/dL   Hgb urine dipstick NEGATIVE  NEGATIVE   Bilirubin Urine NEGATIVE  NEGATIVE   Ketones, ur NEGATIVE  NEGATIVE mg/dL   Protein, ur NEGATIVE  NEGATIVE mg/dL   Urobilinogen, UA 0.2  0.0 - 1.0 mg/dL   Nitrite NEGATIVE  NEGATIVE   Leukocytes, UA NEGATIVE  NEGATIVE       MDM  Patient with ongoing pelvic pain under the care of OB/GYN. Has just finished course of megace for dysfunctional bleeding. Pelvic pain with negative CT and [place]. Follow up with [facility] on Monday. Given antibiotics for PID and analgesics. Pt stable in ED with no significant deterioration in condition.The patient appears reasonably screened and/or stabilized for discharge and I doubt any other medical condition or other [facility] requiring further screening, evaluation, or treatment in the ED at this time prior to discharge.  I personally performed the services described in this documentation, which was scribed in my presence. The recorded information has been reviewed and considered.   MDM Reviewed: nursing note and vitals Interpretation: labs           Nicoletta Dress. Colon Branch, MD 02/26/12 0030

## 2012-02-25 NOTE — ED Notes (Signed)
Patient complaining of lower abdominal pain x 3 days. Also states she has been passing bloody clots from vagina.

## 2012-02-26 MED ORDER — ONDANSETRON HCL 4 MG PO TABS: 4.0000 mg | ORAL_TABLET | Freq: Four times a day (QID) | ORAL | Status: AC

## 2012-02-26 MED ORDER — HYDROCODONE-ACETAMINOPHEN 5-325 MG PO TABS: 1.0000 | ORAL_TABLET | ORAL | Status: DC | PRN

## 2012-03-01 ENCOUNTER — Encounter (HOSPITAL_COMMUNITY): Payer: Self-pay | Admitting: *Deleted

## 2012-03-01 ENCOUNTER — Emergency Department (HOSPITAL_COMMUNITY)
Admission: EM | Admit: 2012-03-01 | Discharge: 2012-03-01 | Disposition: A | Discharge status: Home / Self Care | Payer: MEDICAID | Attending: Emergency Medicine | Admitting: Emergency Medicine

## 2012-03-01 ENCOUNTER — Emergency Department (HOSPITAL_COMMUNITY)
Admission: EM | Admit: 2012-03-01 | Discharge: 2012-03-01 | Discharge status: Against Medical Advice | Payer: MEDICAID | Source: Home / Self Care | Attending: Emergency Medicine | Admitting: Emergency Medicine

## 2012-03-01 ENCOUNTER — Encounter (HOSPITAL_COMMUNITY): Payer: Self-pay | Admitting: Emergency Medicine

## 2012-03-01 DIAGNOSIS — F411 Generalized anxiety disorder: Secondary | ICD-10-CM | POA: Insufficient documentation

## 2012-03-01 DIAGNOSIS — Z5900 Homelessness unspecified: Secondary | ICD-10-CM | POA: Insufficient documentation

## 2012-03-01 DIAGNOSIS — F3289 Other specified depressive episodes: Secondary | ICD-10-CM | POA: Insufficient documentation

## 2012-03-01 DIAGNOSIS — F17200 Nicotine dependence, unspecified, uncomplicated: Secondary | ICD-10-CM | POA: Insufficient documentation

## 2012-03-01 DIAGNOSIS — F419 Anxiety disorder, unspecified: Secondary | ICD-10-CM

## 2012-03-01 DIAGNOSIS — F418 Other specified anxiety disorders: Secondary | ICD-10-CM

## 2012-03-01 DIAGNOSIS — F329 Major depressive disorder, single episode, unspecified: Secondary | ICD-10-CM | POA: Insufficient documentation

## 2012-03-01 HISTORY — DX: Major depressive disorder, single episode, unspecified: F32.9

## 2012-03-01 HISTORY — DX: Anxiety disorder, unspecified: F41.9

## 2012-03-01 HISTORY — DX: Depression, unspecified: F32.A

## 2012-03-01 LAB — CBC WITH DIFFERENTIAL/PLATELET
Basophils Absolute: 0 10*3/uL (ref 0.0–0.1)
Basophils Relative: 0 % (ref 0–1)
HCT: 41 % (ref 36.0–46.0)
Hemoglobin: 14 g/dL (ref 12.0–15.0)
Lymphocytes Relative: 19 % (ref 12–46)
MCHC: 34.1 g/dL (ref 30.0–36.0)
Monocytes Absolute: 0.5 10*3/uL (ref 0.1–1.0)
Neutro Abs: 8 10*3/uL — ABNORMAL HIGH (ref 1.7–7.7)
Neutrophils Relative %: 76 % (ref 43–77)
RDW: 12.9 % (ref 11.5–15.5)
WBC: 10.5 10*3/uL (ref 4.0–10.5)

## 2012-03-01 LAB — RAPID URINE DRUG SCREEN, HOSP PERFORMED
Amphetamines: NOT DETECTED
Benzodiazepines: POSITIVE — AB
Cocaine: NOT DETECTED
Opiates: NOT DETECTED
Tetrahydrocannabinol: POSITIVE — AB

## 2012-03-01 LAB — BASIC METABOLIC PANEL
CO2: 22 mEq/L (ref 19–32)
Chloride: 105 mEq/L (ref 96–112)
GFR calc Af Amer: >90 mL/min (ref >90)
Potassium: 3.9 mEq/L (ref 3.5–5.1)

## 2012-03-01 LAB — URINALYSIS, ROUTINE W REFLEX MICROSCOPIC
Glucose, UA: NEGATIVE mg/dL
Hgb urine dipstick: NEGATIVE
Leukocytes, UA: NEGATIVE
Protein, ur: NEGATIVE mg/dL
Specific Gravity, Urine: 1.025 (ref 1.005–1.030)

## 2012-03-01 LAB — PREGNANCY, URINE: Preg Test, Ur: NEGATIVE

## 2012-03-01 MED ORDER — LORAZEPAM 1 MG PO TABS: 1.0000 mg | ORAL_TABLET | Freq: Once | ORAL | Status: DC

## 2012-03-01 NOTE — ED Notes (Signed)
Pt requesting belongings/clothes so she could change and go outside to smoke, informed pt that this was a non-smoking campus and we do not condone same.  Dr Deretha Emory made aware of same and states it is fine if pt goes outside or leaves, is not under commitment process, no reason to hold pt.
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
## 2012-03-01 NOTE — ED Notes (Signed)
Patient did not return to bed. Will be removed as AMA
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
## 2012-03-01 NOTE — ED Notes (Signed)
Patient speaking with [employer]

## 2012-03-01 NOTE — ED Notes (Signed)
Patient given her belonging by the charge nurse. Patient went to bathroom to change clothes. States she is going outside to smoke. Do not know whether patient will return to her bed or not.
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
## 2012-08-25 ENCOUNTER — Emergency Department (HOSPITAL_COMMUNITY)
Admission: EM | Admit: 2012-08-25 | Discharge: 2012-08-25 | Disposition: A | Discharge status: Home / Self Care | Payer: Medicaid Other | Attending: Emergency Medicine | Admitting: Emergency Medicine

## 2012-08-25 ENCOUNTER — Encounter (HOSPITAL_COMMUNITY): Payer: Self-pay

## 2012-08-25 DIAGNOSIS — K122 Cellulitis and abscess of mouth: Secondary | ICD-10-CM | POA: Insufficient documentation

## 2012-08-25 DIAGNOSIS — K1370 Unspecified lesions of oral mucosa: Secondary | ICD-10-CM | POA: Insufficient documentation

## 2012-08-25 DIAGNOSIS — F17200 Nicotine dependence, unspecified, uncomplicated: Secondary | ICD-10-CM | POA: Insufficient documentation

## 2012-08-25 DIAGNOSIS — Z8659 Personal history of other mental and behavioral disorders: Secondary | ICD-10-CM | POA: Insufficient documentation

## 2012-08-25 DIAGNOSIS — R221 Localized swelling, mass and lump, neck: Secondary | ICD-10-CM | POA: Insufficient documentation

## 2012-08-25 DIAGNOSIS — R220 Localized swelling, mass and lump, head: Secondary | ICD-10-CM | POA: Insufficient documentation

## 2012-08-25 MED ORDER — AMOXICILLIN 500 MG PO CAPS: 500.0000 mg | ORAL_CAPSULE | Freq: Three times a day (TID) | ORAL | Status: DC

## 2012-08-25 MED ORDER — HYDROCODONE-ACETAMINOPHEN 5-325 MG PO TABS: 1.0000 | ORAL_TABLET | ORAL | Status: DC | PRN

## 2012-08-25 NOTE — ED Notes (Signed)
Pt was seen by NP and ready for d/c when seen by me.

## 2012-08-25 NOTE — ED Notes (Signed)
Pt reports that she started having dental swelling to the front of her mouth yesterday.

## 2012-08-25 NOTE — ED Provider Notes (Signed)
History     CSN: [id number]  Arrival date & time 08/25/12  1249   First MD Initiated Contact with Patient 08/25/12 1302      Chief Complaint  Patient presents with  . Dental Pain    HPI Elizabeth [name] is a 37 y.o. female who presents to the ED with dental pain. The pain started yesterday with swelling to the front of her mouth. The swelling is located under the tongue on the floor of the mouth near the front teeth. She rates the pain as 8/10. The history was provided by the patient.  Past Medical History  Diagnosis Date  . Anxiety   . Depression     Past Surgical History  Procedure Laterality Date  . Tubal ligation      No family history on file.  History  Substance Use Topics  . Smoking status: Current Every Day Smoker -- 0.50 packs/day    Types: Cigarettes  . Smokeless tobacco: Not on file  . Alcohol Use: Yes     Comment: occasionally    OB History   Grav Para Term Preterm Abortions TAB SAB Ect Mult Living                  Review of Systems  Constitutional: Negative for fever and chills.  HENT: Positive for facial swelling, mouth sores and dental problem. Negative for ear pain.   Eyes: Negative for pain.  Respiratory: Negative for cough and wheezing.   Cardiovascular: Negative for chest pain.  Gastrointestinal: Negative for abdominal pain.  Neurological: Negative for light-headedness and headaches.  Psychiatric/Behavioral: Negative for confusion. The patient is not nervous/anxious.     Allergies  Review of patient's allergies indicates no known allergies.  Home Medications   Current Outpatient Rx  Name  Route  Sig  Dispense  Refill  . acetaminophen (TYLENOL) 500 MG tablet   Oral   Take 1,000 mg by mouth every 6 (six) hours as needed for pain.         Marland Kitchen ibuprofen (ADVIL,MOTRIN) 200 MG tablet   Oral   Take 600 mg by mouth as needed for pain.            BP 125/84  Pulse 82  Temp(Src) 98.8 F (37.1 C) (Oral)  Resp 20  Ht 5\' 1"  (1.549 m)   Wt 140 lb (63.504 kg)  BMI 26.47 kg/m2  SpO2 99%  LMP 07/29/2012  Physical Exam  Nursing note and vitals reviewed. Constitutional: She is oriented to person, place, and time. She appears well-developed and well-nourished. No distress.  HENT:  Head: Normocephalic and atraumatic.  Nose: Nose normal.  Mouth/Throat:    Abscess to mucous membrane area near bottom front teeth. Tender on exam. White pointing area noted.  Eyes: EOM are normal. Pupils are equal, round, and reactive to light.  Neck: Neck supple.  Cardiovascular: Normal rate and regular rhythm.   Pulmonary/Chest: Effort normal.  Musculoskeletal: Normal range of motion. She exhibits no edema.  Neurological: She is alert and oriented to person, place, and time. No cranial nerve deficit.  Skin: Skin is warm and dry.  Psychiatric: She has a normal mood and affect. Her behavior is normal. Judgment and thought content normal.   Procedures  Assessment: 37 y.o. female with abscess to gum  Plan:  Antibiotics, pain management  Discussed with the patient and all questioned fully answered.    Medication List    TAKE these medications       amoxicillin  500 MG capsule  Commonly known as:  AMOXIL  Take 1 capsule (500 mg total) by mouth 3 (three) times daily.     HYDROcodone-acetaminophen 5-325 MG per tablet  Commonly known as:  NORCO/VICODIN  Take 1 tablet by mouth every 4 (four) hours as needed for pain.      ASK your doctor about these medications       acetaminophen 500 MG tablet  Commonly known as:  TYLENOL  Take 1,000 mg by mouth every 6 (six) hours as needed for pain.     ibuprofen 200 MG tablet  Commonly known as:  ADVIL,MOTRIN  Take 600 mg by mouth as needed for pain.              Janne Napoleon, [place] 08/25/12 1328

## 2012-08-26 NOTE — ED Provider Notes (Signed)
Medical screening examination/treatment/procedure(s) were performed by non-physician practitioner and as supervising physician I was immediately available for consultation/collaboration.  Romen Yutzy, MD 08/26/12 0716 

## 2014-09-08 ENCOUNTER — Emergency Department (HOSPITAL_COMMUNITY)
Admission: EM | Admit: 2014-09-08 | Discharge: 2014-09-08 | Disposition: A | Discharge status: Home / Self Care | Payer: 59 | Attending: Emergency Medicine | Admitting: Emergency Medicine

## 2014-09-08 ENCOUNTER — Encounter (HOSPITAL_COMMUNITY): Payer: Self-pay | Admitting: *Deleted

## 2014-09-08 DIAGNOSIS — H109 Unspecified conjunctivitis: Secondary | ICD-10-CM | POA: Insufficient documentation

## 2014-09-08 DIAGNOSIS — Z792 Long term (current) use of antibiotics: Secondary | ICD-10-CM | POA: Diagnosis not present

## 2014-09-08 DIAGNOSIS — R300 Dysuria: Secondary | ICD-10-CM | POA: Diagnosis not present

## 2014-09-08 DIAGNOSIS — F419 Anxiety disorder, unspecified: Secondary | ICD-10-CM | POA: Insufficient documentation

## 2014-09-08 DIAGNOSIS — Z79899 Other long term (current) drug therapy: Secondary | ICD-10-CM | POA: Insufficient documentation

## 2014-09-08 DIAGNOSIS — H5711 Ocular pain, right eye: Secondary | ICD-10-CM | POA: Diagnosis present

## 2014-09-08 DIAGNOSIS — Z720 Tobacco use: Secondary | ICD-10-CM | POA: Insufficient documentation

## 2014-09-08 LAB — URINALYSIS, ROUTINE W REFLEX MICROSCOPIC
Bilirubin Urine: NEGATIVE
GLUCOSE, UA: NEGATIVE mg/dL
HGB URINE DIPSTICK: NEGATIVE
Ketones, ur: NEGATIVE mg/dL
LEUKOCYTES UA: NEGATIVE
Nitrite: NEGATIVE
PH: 7 (ref 5.0–8.0)
Protein, ur: NEGATIVE mg/dL
SPECIFIC GRAVITY, URINE: 1.01 (ref 1.005–1.030)
Urobilinogen, UA: 0.2 mg/dL (ref 0.0–1.0)

## 2014-09-08 LAB — PREGNANCY, URINE: PREG TEST UR: NEGATIVE

## 2014-09-08 MED ORDER — FLUORESCEIN SODIUM 1 MG OP STRP
ORAL_STRIP | OPHTHALMIC | Status: AC
  Filled 2014-09-08: qty 1

## 2014-09-08 MED ORDER — TOBRAMYCIN 0.3 % OP SOLN
2.0000 [drp] | Freq: Once | OPHTHALMIC | Status: AC
  Administered 2014-09-08: 2 [drp] via OPHTHALMIC
  Filled 2014-09-08: qty 5

## 2014-09-08 MED ORDER — CELECOXIB 100 MG PO CAPS: 100.0000 mg | ORAL_CAPSULE | Freq: Two times a day (BID) | ORAL | Status: DC

## 2014-09-08 MED ORDER — HYDROCODONE-ACETAMINOPHEN 5-325 MG PO TABS: 1.0000 | ORAL_TABLET | ORAL | Status: DC | PRN

## 2014-09-08 MED ORDER — TETRACAINE HCL 0.5 % OP SOLN
OPHTHALMIC | Status: AC
  Filled 2014-09-08: qty 2

## 2014-09-08 MED ORDER — HYDROCODONE-ACETAMINOPHEN 5-325 MG PO TABS
2.0000 | ORAL_TABLET | Freq: Once | ORAL | Status: AC
Start: 2014-09-08 — End: 2014-09-08
  Administered 2014-09-08: 2 via ORAL
  Filled 2014-09-08: qty 2

## 2014-09-08 MED ORDER — KETOROLAC TROMETHAMINE 10 MG PO TABS
10.0000 mg | ORAL_TABLET | Freq: Once | ORAL | Status: AC
  Administered 2014-09-08: 10 mg via ORAL
  Filled 2014-09-08: qty 1

## 2014-09-08 NOTE — ED Notes (Signed)
Pt verbalized understanding of no driving and to use caution within 4 hours of taking pain meds due to meds cause drowsiness 

## 2014-09-08 NOTE — ED Provider Notes (Signed)
CSN: [id number]     Arrival date & time 09/08/14  1436 History   First MD Initiated Contact with Patient 09/08/14 1444     Chief Complaint  Patient presents with  . Eye Pain     (Consider location/radiation/quality/duration/timing/severity/associated sxs/prior Treatment) Patient is a 39 y.o. female presenting with eye pain. The history is provided by the patient.  Eye Pain This is a new problem. The current episode started in the past 7 days. The problem occurs intermittently. The problem has been gradually worsening. Pertinent negatives include no abdominal pain, arthralgias, chest pain, coughing or neck pain. Nothing aggravates the symptoms. She has tried nothing for the symptoms. The treatment provided no relief.    Past Medical History  Diagnosis Date  . Anxiety   . Depression    Past Surgical History  Procedure Laterality Date  . Tubal ligation     History reviewed. No pertinent family history. History  Substance Use Topics  . Smoking status: Current Every Day Smoker -- 0.50 packs/day    Types: Cigarettes  . Smokeless tobacco: Not on file  . Alcohol Use: Yes     Comment: occasionally   OB History    No data available     Review of Systems  Constitutional: Negative for activity change.       All ROS Neg except as noted in HPI  HENT: Negative.   Eyes: Positive for photophobia, pain and redness. Negative for discharge.  Respiratory: Negative for cough, shortness of breath and wheezing.   Cardiovascular: Negative for chest pain and palpitations.  Gastrointestinal: Negative for abdominal pain and blood in stool.  Genitourinary: Positive for dysuria. Negative for frequency and hematuria.  Musculoskeletal: Negative for back pain, arthralgias and neck pain.  Skin: Negative.   Neurological: Negative for dizziness, seizures and speech difficulty.  Psychiatric/Behavioral: Negative for hallucinations and confusion. The patient is nervous/anxious.       Allergies  Review  of patient's allergies indicates no known allergies.  Home Medications   Prior to Admission medications   Medication Sig Start Date End Date Taking? Authorizing Provider  acetaminophen (TYLENOL) 500 MG tablet Take 1,000 mg by mouth every 6 (six) hours as needed for pain.    Historical Provider, MD  amoxicillin (AMOXIL) 500 MG capsule Take 1 capsule (500 mg total) by mouth 3 (three) times daily. 08/25/12   Hope Orlene OchM Neese, NP  HYDROcodone-acetaminophen (NORCO/VICODIN) 5-325 MG per tablet Take 1 tablet by mouth every 4 (four) hours as needed for pain. 08/25/12   Hope Orlene OchM Neese, NP  ibuprofen (ADVIL,MOTRIN) 200 MG tablet Take 600 mg by mouth as needed for pain.     Historical Provider, MD   BP 116/44 mmHg  Pulse 84  Temp(Src) 98.7 F (37.1 C) (Oral)  Resp 16  Ht 5\' 2"  (1.575 m)  Wt 139 lb (63.05 kg)  BMI 25.42 kg/m2  SpO2 100%  LMP 08/25/2014 Physical Exam  Constitutional: She is oriented to person, place, and time. She appears well-developed and well-nourished.  Non-toxic appearance.  HENT:  Head: Normocephalic.  Right Ear: Tympanic membrane and external ear normal.  Left Ear: Tympanic membrane and external ear normal.  Eyes: EOM and lids are normal. Pupils are equal, round, and reactive to light. Right eye exhibits no chemosis, no discharge, no exudate and no hordeolum. No foreign body present in the right eye. Left eye exhibits no chemosis, no discharge, no exudate and no hordeolum. No foreign body present in the left eye. Right conjunctiva is  not injected. Left conjunctiva is not injected. No scleral icterus.  Fundoscopic exam:      The right eye shows no AV nicking, no exudate, no hemorrhage and no papilledema.       The left eye shows no AV nicking, no exudate, no hemorrhage and no papilledema.  Slit lamp exam:      The right eye shows no corneal abrasion, no corneal ulcer, no foreign body, no hyphema and no fluorescein uptake.  Neck: Normal range of motion. Neck supple. Carotid bruit is  not present.  Cardiovascular: Normal rate, regular rhythm, normal heart sounds, intact distal pulses and normal pulses.   Pulmonary/Chest: Breath sounds normal. No respiratory distress.  Abdominal: Soft. Bowel sounds are normal. There is no tenderness. There is no guarding.  Musculoskeletal: Normal range of motion.  Lymphadenopathy:       Head (right side): No submandibular adenopathy present.       Head (left side): No submandibular adenopathy present.    She has no cervical adenopathy.  Neurological: She is alert and oriented to person, place, and time. She has normal strength. No cranial nerve deficit or sensory deficit.  Skin: Skin is warm and dry.  Psychiatric: She has a normal mood and affect. Her speech is normal.  Nursing note and vitals reviewed.   ED Course  Procedures (including critical care time) Labs Review Labs Reviewed  PREGNANCY, URINE  URINALYSIS, ROUTINE W REFLEX MICROSCOPIC    Imaging Review No results found.   EKG Interpretation None      MDM  Exam is consistent with conjunctivitis. Pt treated with cool compress, tobramycin, and norco. Pt reports dysuria. UA wnl. She has Gyn appointment next week.   Final diagnoses:  None    **I have reviewed nursing notes, vital signs, and all appropriate lab and imaging results for this patient.Kathie Dike, PA-C 09/08/14 1539  Donnetta Hutching, MD 09/09/14 [phone]

## 2014-09-08 NOTE — ED Notes (Signed)
Pain rt eye for 3 days, removed contact from eye this am.  Dysuria for 3 days.  Nausea. No vomiting.

## 2014-09-08 NOTE — Discharge Instructions (Signed)
Ear examination is consistent with conjunctivitis (pink eye). This is very contagious. Please wash hands frequently. Please use dark glasses, and a hat with brim until symptoms have resolved. Wipe surfaces frequently. Use 2 tobramycin eyedrops to the right eye every 4 hours for the next 5 days. Use Celebrex 2 times daily until all taken for inflammation. Use Norco for pain and headache if needed. Cool compresses will be helpful to your eye. Please do not use contact until symptoms have completely resolved. Conjunctivitis Conjunctivitis is commonly called "pink eye." Conjunctivitis can be caused by bacterial or viral infection, allergies, or injuries. There is usually redness of the lining of the eye, itching, discomfort, and sometimes discharge. There may be deposits of matter along the eyelids. A viral infection usually causes a watery discharge, while a bacterial infection causes a yellowish, thick discharge. Pink eye is very contagious and spreads by direct contact. You may be given antibiotic eyedrops as part of your treatment. Before using your eye medicine, remove all drainage from the eye by washing gently with warm water and cotton balls. Continue to use the medication until you have awakened 2 mornings in a row without discharge from the eye. Do not rub your eye. This increases the irritation and helps spread infection. Use separate towels from other household members. Wash your hands with soap and water before and after touching your eyes. Use cold compresses to reduce pain and sunglasses to relieve irritation from light. Do not wear contact lenses or wear eye makeup until the infection is gone. SEEK MEDICAL CARE IF:   Your symptoms are not better after 3 days of treatment.  You have increased pain or trouble seeing.  The outer eyelids become very red or swollen. Document Released: 08/01/2004 Document Revised: 09/16/2011 Document Reviewed: 06/24/2005 [facility] Patient Information 2015  [place]. This information is not intended to replace advice given to you by your health care provider. Make sure you discuss any questions you have with your health care provider.

## 2014-10-05 ENCOUNTER — Ambulatory Visit: Payer: Self-pay | Admitting: Family

## 2014-10-19 ENCOUNTER — Other Ambulatory Visit: Payer: Self-pay | Admitting: Family

## 2014-11-11 ENCOUNTER — Encounter: Payer: Self-pay | Admitting: Family

## 2014-11-11 ENCOUNTER — Ambulatory Visit (INDEPENDENT_AMBULATORY_CARE_PROVIDER_SITE_OTHER): Payer: 59 | Admitting: Family

## 2014-11-11 VITALS — BP 101/70 | HR 74 | Temp 97.8°F | Ht 62.0 in | Wt 137.0 lb

## 2014-11-11 DIAGNOSIS — Z0000 Encounter for general adult medical examination without abnormal findings: Secondary | ICD-10-CM

## 2014-11-11 DIAGNOSIS — M25512 Pain in left shoulder: Secondary | ICD-10-CM | POA: Diagnosis not present

## 2014-11-11 DIAGNOSIS — M25511 Pain in right shoulder: Secondary | ICD-10-CM

## 2014-11-11 DIAGNOSIS — Z01419 Encounter for gynecological examination (general) (routine) without abnormal findings: Secondary | ICD-10-CM | POA: Diagnosis not present

## 2014-11-11 LAB — POCT CBC
GRANULOCYTE PERCENT: 59.2 % (ref 37–80)
HEMATOCRIT: 40.3 % (ref 37.7–47.9)
Hemoglobin: 13 g/dL (ref 12.2–16.2)
Lymph, poc: 2.8 (ref 0.6–3.4)
MCH, POC: 30.2 pg (ref 27–31.2)
MCHC: 32.3 g/dL (ref 31.8–35.4)
MCV: 93.7 fL (ref 80–97)
MPV: 7.3 fL (ref 0–99.8)
PLATELET COUNT, POC: 303 10*3/uL (ref 142–424)
POC GRANULOCYTE: 5 (ref 2–6.9)
POC LYMPH PERCENT: 33.2 %L (ref 10–50)
RBC: 4.3 M/uL (ref 4.04–5.48)
RDW, POC: 13 %
WBC: 8.5 10*3/uL (ref 4.6–10.2)

## 2014-11-11 MED ORDER — KETOROLAC TROMETHAMINE 60 MG/2ML IM SOLN
60.0000 mg | Freq: Once | INTRAMUSCULAR | Status: AC
  Administered 2014-11-11: 60 mg via INTRAMUSCULAR

## 2014-11-11 MED ORDER — METHYLPREDNISOLONE ACETATE 80 MG/ML IJ SUSP
80.0000 mg | Freq: Once | INTRAMUSCULAR | Status: AC
  Administered 2014-11-11: 80 mg via INTRAMUSCULAR

## 2014-11-11 NOTE — Progress Notes (Signed)
   Subjective:    Patient ID: Elizabeth [name], female    DOB: 10/04/1975, 39 y.o.   MRN: [id number]  HPI Pt presents to the office today for CPE with pap. Pt currently not taking any prescribed medications at this time. Pt states she has shoulder pain daily and takes OTC tylenol as needed. Pt denies any headache, palpitations, SOB, or edema at this time.     Review of Systems  Constitutional: Negative.   HENT: Negative.   Eyes: Negative.   Respiratory: Negative.  Negative for shortness of breath.   Cardiovascular: Negative.  Negative for palpitations.  Gastrointestinal: Negative.   Endocrine: Negative.   Genitourinary: Negative.   Musculoskeletal: Negative.   Neurological: Negative.  Negative for headaches.  Hematological: Negative.   Psychiatric/Behavioral: Negative.   All other systems reviewed and are negative.      Objective:   Physical Exam  Constitutional: She is oriented to person, place, and time. She appears well-developed and well-nourished. No distress.  HENT:  Head: Normocephalic and atraumatic.  Right Ear: External ear normal.  Mouth/Throat: Oropharynx is clear and moist.  Eyes: Pupils are equal, round, and reactive to light.  Neck: Normal range of motion. Neck supple. No thyromegaly present.  Cardiovascular: Normal rate, regular rhythm, normal heart sounds and intact distal pulses.   No murmur heard. Pulmonary/Chest: Effort normal and breath sounds normal. No respiratory distress. She has no wheezes. Right breast exhibits no inverted nipple, no mass, no nipple discharge, no skin change and no tenderness. Left breast exhibits no inverted nipple, no mass, no nipple discharge, no skin change and no tenderness. Breasts are symmetrical.  Abdominal: Soft. Bowel sounds are normal. She exhibits no distension. There is no tenderness.  Genitourinary: Vagina normal.  Bimanual exam- no adnexal masses or tenderness, ovaries nonpalpable   Cervix parous and pink- No  discharge   Musculoskeletal: Normal range of motion. She exhibits no edema or tenderness.  Neurological: She is alert and oriented to person, place, and time. She has normal reflexes. No cranial nerve deficit.  Skin: Skin is warm and dry.  Psychiatric: She has a normal mood and affect. Her behavior is normal. Judgment and thought content normal.  Vitals reviewed.   BP 101/70 mmHg  Pulse 74  Temp(Src) 97.8 F (36.6 C) (Oral)  Ht _0  (1.575 m)  Wt 137 lb (62.143 kg)  BMI 25.05 kg/m2       Assessment & Plan:  1. Annual physical exam - POCT CBC - CMP14+EGFR - Lipid panel - Thyroid Panel With TSH - Vit D  25 hydroxy (rtn osteoporosis monitoring) - Pap IG w/ reflex to HPV when ASC-U  2. Encounter for routine gynecological examination - Pap IG w/ reflex to HPV when ASC-U  3. Bilateral shoulder pain -Rest -Ice and heat as needed -Tylenol prn for pain - ketorolac (TORADOL) injection 60 mg; Inject 2 mLs (60 mg total) into the muscle once. - methylPREDNISolone acetate (DEPO-MEDROL) injection 80 mg; Inject 1 mL (80 mg total) into the muscle once.   Continue all meds Labs pending Health Maintenance reviewed Diet and exercise encouraged RTO 1 year  Evelina Dun, FNP

## 2014-11-11 NOTE — Patient Instructions (Addendum)
Health Maintenance Adopting a healthy lifestyle and getting preventive care can go a long way to promote health and wellness. Talk with your health care provider about what schedule of regular examinations is right for you. This is a good chance for you to check in with your provider about disease prevention and staying healthy. In between checkups, there are plenty of things you can do on your own. Experts have done a lot of research about which lifestyle changes and preventive measures are most likely to keep you healthy. Ask your health care provider for more information. WEIGHT AND DIET  Eat a healthy diet  Be sure to include plenty of vegetables, fruits, low-fat dairy products, and lean protein.  Do not eat a lot of foods high in solid fats, added sugars, or salt.  Get regular exercise. This is one of the most important things you can do for your health.  Most adults should exercise for at least 150 minutes each week. The exercise should increase your heart rate and make you sweat (moderate-intensity exercise).  Most adults should also do strengthening exercises at least twice a week. This is in addition to the moderate-intensity exercise.  Maintain a healthy weight  Body mass index (BMI) is a measurement that can be used to identify possible weight problems. It estimates body fat based on height and weight. Your health care provider can help determine your BMI and help you achieve or maintain a healthy weight.  For females 25 years of age and older:   A BMI below 18.5 is considered underweight.  A BMI of 18.5 to 24.9 is normal.  A BMI of 25 to 29.9 is considered overweight.  A BMI of 30 and above is considered obese.  Watch levels of cholesterol and blood lipids  You should start having your blood tested for lipids and cholesterol at 37 years of age, then have this test every 5 years.  You may need to have your cholesterol levels checked more often if:  Your lipid or  cholesterol levels are high.  You are older than 37 years of age.  You are at high risk for heart disease.  CANCER SCREENING   Lung Cancer  Lung cancer screening is recommended for adults 97-92 years old who are at high risk for lung cancer because of a history of smoking.  A yearly low-dose CT scan of the lungs is recommended for people who:  Currently smoke.  Have quit within the past 15 years.  Have at least a 30-pack-year history of smoking. A pack year is smoking an average of one pack of cigarettes a day for 1 year.  Yearly screening should continue until it has been 15 years since you quit.  Yearly screening should stop if you develop a health problem that would prevent you from having lung cancer treatment.  Breast Cancer  Practice breast self-awareness. This means understanding how your breasts normally appear and feel.  It also means doing regular breast self-exams. Let your health care provider know about any changes, no matter how small.  If you are in your 20s or 30s, you should have a clinical breast exam (CBE) by a health care provider every 1-3 years as part of a regular health exam.  If you are 76 or older, have a CBE every year. Also consider having a breast X-ray (mammogram) every year.  If you have a family history of breast cancer, talk to your health care provider about genetic screening.  If you are  at high risk for breast cancer, talk to your health care provider about having an MRI and a mammogram every year.  Breast cancer gene (BRCA) assessment is recommended for women who have family members with BRCA-related cancers. BRCA-related cancers include:  Breast.  Ovarian.  Tubal.  Peritoneal cancers.  Results of the assessment will determine the need for genetic counseling and BRCA1 and BRCA2 testing. Cervical Cancer Routine pelvic examinations to screen for cervical cancer are no longer recommended for nonpregnant women who are considered low  risk for cancer of the pelvic organs (ovaries, uterus, and vagina) and who do not have symptoms. A pelvic examination may be necessary if you have symptoms including those associated with pelvic infections. Ask your health care provider if a screening pelvic exam is right for you.   The Pap test is the screening test for cervical cancer for women who are considered at risk.  If you had a hysterectomy for a problem that was not cancer or a condition that could lead to cancer, then you no longer need Pap tests.  If you are older than 65 years, and you have had normal Pap tests for the past 10 years, you no longer need to have Pap tests.  If you have had past treatment for cervical cancer or a condition that could lead to cancer, you need Pap tests and screening for cancer for at least 20 years after your treatment.  If you no longer get a Pap test, assess your risk factors if they change (such as having a new sexual partner). This can affect whether you should start being screened again.  Some women have medical problems that increase their chance of getting cervical cancer. If this is the case for you, your health care provider may recommend more frequent screening and Pap tests.  The human papillomavirus (HPV) test is another test that may be used for cervical cancer screening. The HPV test looks for the virus that can cause cell changes in the cervix. The cells collected during the Pap test can be tested for HPV.  The HPV test can be used to screen women 30 years of age and older. Getting tested for HPV can extend the interval between normal Pap tests from three to five years.  An HPV test also should be used to screen women of any age who have unclear Pap test results.  After 37 years of age, women should have HPV testing as often as Pap tests.  Colorectal Cancer  This type of cancer can be detected and often prevented.  Routine colorectal cancer screening usually begins at 37 years of  age and continues through 37 years of age.  Your health care provider may recommend screening at an earlier age if you have risk factors for colon cancer.  Your health care provider may also recommend using home test kits to check for hidden blood in the stool.  A small camera at the end of a tube can be used to examine your colon directly (sigmoidoscopy or colonoscopy). This is done to check for the earliest forms of colorectal cancer.  Routine screening usually begins at age 50.  Direct examination of the colon should be repeated every 5-10 years through 37 years of age. However, you may need to be screened more often if early forms of precancerous polyps or small growths are found. Skin Cancer  Check your skin from head to toe regularly.  Tell your health care provider about any new moles or changes in   moles, especially if there is a change in a mole's shape or color.  Also tell your health care provider if you have a mole that is larger than the size of a pencil eraser.  Always use sunscreen. Apply sunscreen liberally and repeatedly throughout the day.  Protect yourself by wearing long sleeves, pants, a wide-brimmed hat, and sunglasses whenever you are outside. HEART DISEASE, DIABETES, AND HIGH BLOOD PRESSURE   Have your blood pressure checked at least every 1-2 years. High blood pressure causes heart disease and increases the risk of stroke.  If you are between 75 years and 42 years old, ask your health care provider if you should take aspirin to prevent strokes.  Have regular diabetes screenings. This involves taking a blood sample to check your fasting blood sugar level.  If you are at a normal weight and have a low risk for diabetes, have this test once every three years after 37 years of age.  If you are overweight and have a high risk for diabetes, consider being tested at a younger age or more often. PREVENTING INFECTION  Hepatitis B  If you have a higher risk for  hepatitis B, you should be screened for this virus. You are considered at high risk for hepatitis B if:  You were born in a country where hepatitis B is common. Ask your health care provider which countries are considered high risk.  Your parents were born in a high-risk country, and you have not been immunized against hepatitis B (hepatitis B vaccine).  You have HIV or AIDS.  You use needles to inject street drugs.  You live with someone who has hepatitis B.  You have had sex with someone who has hepatitis B.  You get hemodialysis treatment.  You take certain medicines for conditions, including cancer, organ transplantation, and autoimmune conditions. Hepatitis C  Blood testing is recommended for:  Everyone born from [birth year] through 1963.  Anyone with known risk factors for hepatitis C. Sexually transmitted infections (STIs)  You should be screened for sexually transmitted infections (STIs) including gonorrhea and chlamydia if:  You are sexually active and are younger than 37 years of age.  You are older than 37 years of age and your health care provider tells you that you are at risk for this type of infection.  Your sexual activity has changed since you were last screened and you are at an increased risk for chlamydia or gonorrhea. Ask your health care provider if you are at risk.  If you do not have HIV, but are at risk, it may be recommended that you take a prescription medicine daily to prevent HIV infection. This is called pre-exposure prophylaxis (PrEP). You are considered at risk if:  You are sexually active and do not regularly use condoms or know the HIV status of your partner(s).  You take drugs by injection.  You are sexually active with a partner who has HIV. Talk with your health care provider about whether you are at high risk of being infected with HIV. If you choose to begin PrEP, you should first be tested for HIV. You should then be tested every 3 months for  as long as you are taking PrEP.  PREGNANCY   If you are premenopausal and you may become pregnant, ask your health care provider about preconception counseling.  If you may become pregnant, take 400 to 800 micrograms (mcg) of folic acid every day.  If you want to prevent pregnancy, talk to your  health care provider about birth control (contraception). OSTEOPOROSIS AND MENOPAUSE   Osteoporosis is a disease in which the bones lose minerals and strength with aging. This can result in serious bone fractures. Your risk for osteoporosis can be identified using a bone density scan.  If you are 62 years of age or older, or if you are at risk for osteoporosis and fractures, ask your health care provider if you should be screened.  Ask your health care provider whether you should take a calcium or vitamin D supplement to lower your risk for osteoporosis.  Menopause may have certain physical symptoms and risks.  Hormone replacement therapy may reduce some of these symptoms and risks. Talk to your health care provider about whether hormone replacement therapy is right for you.  HOME CARE INSTRUCTIONS   Schedule regular health, dental, and eye exams.  Stay current with your immunizations.   Do not use any tobacco products including cigarettes, chewing tobacco, or electronic cigarettes.  If you are pregnant, do not drink alcohol.  If you are breastfeeding, limit how much and how often you drink alcohol.  Limit alcohol intake to no more than 1 drink per day for nonpregnant women. One drink equals 12 ounces of beer, 5 ounces of wine, or 1 ounces of hard liquor.  Do not use street drugs.  Do not share needles.  Ask your health care provider for help if you need support or information about quitting drugs.  Tell your health care provider if you often feel depressed.  Tell your health care provider if you have ever been abused or do not feel safe at home. Document Released: 01/07/2011  Document Revised: 11/08/2013 Document Reviewed: 05/26/2013 [facility] Patient Information 2015 [place], [place]. This information is not intended to replace advice given to you by your health care provider. Make sure you discuss any questions you have with your health care provider. Shoulder Pain The shoulder is the joint that connects your arms to your body. The bones that form the shoulder joint include the upper arm bone (humerus), the shoulder blade (scapula), and the collarbone (clavicle). The top of the humerus is shaped like a ball and fits into a rather flat socket on the scapula (glenoid cavity). A combination of muscles and strong, fibrous tissues that connect muscles to bones (tendons) support your shoulder joint and hold the ball in the socket. Small, fluid-filled sacs (bursae) are located in different areas of the joint. They act as cushions between the bones and the overlying soft tissues and help reduce friction between the gliding tendons and the bone as you move your arm. Your shoulder joint allows a wide range of motion in your arm. This range of motion allows you to do things like scratch your back or throw a ball. However, this range of motion also makes your shoulder more prone to pain from overuse and injury. Causes of shoulder pain can originate from both injury and overuse and usually can be grouped in the following four categories:  Redness, swelling, and pain (inflammation) of the tendon (tendinitis) or the bursae (bursitis).  Instability, such as a dislocation of the joint.  Inflammation of the joint (arthritis).  Broken bone (fracture). HOME CARE INSTRUCTIONS   Apply ice to the sore area.  Put ice in a plastic bag.  Place a towel between your skin and the bag.  Leave the ice on for 15-20 minutes, 3-4 times per day for the first 2 days, or as directed by your health care provider.  Stop using cold packs if they do not help with the pain.  If you have a shoulder sling  or immobilizer, wear it as long as your caregiver instructs. Only remove it to shower or bathe. Move your arm as little as possible, but keep your hand moving to prevent swelling.  Squeeze a soft ball or foam pad as much as possible to help prevent swelling.  Only take over-the-counter or prescription medicines for pain, discomfort, or fever as directed by your caregiver. SEEK MEDICAL CARE IF:   Your shoulder pain increases, or new pain develops in your arm, hand, or fingers.  Your hand or fingers become cold and numb.  Your pain is not relieved with medicines. SEEK IMMEDIATE MEDICAL CARE IF:   Your arm, hand, or fingers are numb or tingling.  Your arm, hand, or fingers are significantly swollen or turn white or blue. MAKE SURE YOU:   Understand these instructions.  Will watch your condition.  Will get help right away if you are not doing well or get worse. Document Released: 04/03/2005 Document Revised: 11/08/2013 Document Reviewed: 06/08/2011 [facility] Patient Information 2015 [place], [place]. This information is not intended to replace advice given to you by your health care provider. Make sure you discuss any questions you have with your health care provider.

## 2014-11-12 LAB — CMP14+EGFR
A/G RATIO: 1.7 (ref 1.1–2.5)
ALK PHOS: 54 IU/L (ref 39–117)
ALT: 8 IU/L (ref 0–32)
AST: 11 IU/L (ref 0–40)
Albumin: 4.2 g/dL (ref 3.5–5.5)
BUN/Creatinine Ratio: 11 (ref 8–20)
BUN: 6 mg/dL (ref 6–20)
Bilirubin Total: <0.2 mg/dL (ref 0.0–1.2)
CALCIUM: 9.1 mg/dL (ref 8.7–10.2)
CO2: 24 mmol/L (ref 18–29)
Chloride: 101 mmol/L (ref 97–108)
Creatinine, Ser: 0.55 mg/dL — ABNORMAL LOW (ref 0.57–1.00)
GFR, EST AFRICAN AMERICAN: 139 mL/min/{1.73_m2} (ref >59)
GFR, EST NON AFRICAN AMERICAN: 120 mL/min/{1.73_m2} (ref >59)
GLUCOSE: 88 mg/dL (ref 65–99)
Globulin, Total: 2.5 g/dL (ref 1.5–4.5)
POTASSIUM: 4.1 mmol/L (ref 3.5–5.2)
Sodium: 138 mmol/L (ref 134–144)
Total Protein: 6.7 g/dL (ref 6.0–8.5)

## 2014-11-12 LAB — LIPID PANEL
Chol/HDL Ratio: 3.6 ratio units (ref 0.0–4.4)
Cholesterol, Total: 172 mg/dL (ref 100–199)
HDL: 48 mg/dL (ref >39)
LDL Calculated: 96 mg/dL (ref 0–99)
Triglycerides: 141 mg/dL (ref 0–149)
VLDL Cholesterol Cal: 28 mg/dL (ref 5–40)

## 2014-11-12 LAB — THYROID PANEL WITH TSH
Free Thyroxine Index: 1.6 (ref 1.2–4.9)
T3 Uptake Ratio: 28 % (ref 24–39)
T4 TOTAL: 5.8 ug/dL (ref 4.5–12.0)
TSH: 2.14 u[IU]/mL (ref 0.450–4.500)

## 2014-11-12 LAB — VITAMIN D 25 HYDROXY (VIT D DEFICIENCY, FRACTURES): Vit D, 25-Hydroxy: 20.9 ng/mL — ABNORMAL LOW (ref 30.0–100.0)

## 2014-11-15 ENCOUNTER — Other Ambulatory Visit: Payer: Self-pay | Admitting: Family

## 2014-11-15 DIAGNOSIS — E559 Vitamin D deficiency, unspecified: Secondary | ICD-10-CM | POA: Insufficient documentation

## 2014-11-15 LAB — PAP IG W/ RFLX HPV ASCU: PAP SMEAR COMMENT: 0

## 2014-11-15 MED ORDER — VITAMIN D (ERGOCALCIFEROL) 1.25 MG (50000 UNIT) PO CAPS: 50000.0000 [IU] | ORAL_CAPSULE | ORAL | Status: DC

## 2015-05-18 ENCOUNTER — Ambulatory Visit (INDEPENDENT_AMBULATORY_CARE_PROVIDER_SITE_OTHER): Payer: 59

## 2015-05-18 DIAGNOSIS — Z23 Encounter for immunization: Secondary | ICD-10-CM | POA: Diagnosis not present

## 2015-10-16 ENCOUNTER — Emergency Department (HOSPITAL_COMMUNITY)
Admission: EM | Admit: 2015-10-16 | Discharge: 2015-10-16 | Disposition: A | Discharge status: Home / Self Care | Payer: No Typology Code available for payment source | Attending: Emergency Medicine | Admitting: Emergency Medicine

## 2015-10-16 ENCOUNTER — Emergency Department (HOSPITAL_COMMUNITY): Payer: No Typology Code available for payment source

## 2015-10-16 ENCOUNTER — Encounter (HOSPITAL_COMMUNITY): Payer: Self-pay | Admitting: Emergency Medicine

## 2015-10-16 DIAGNOSIS — S60222A Contusion of left hand, initial encounter: Secondary | ICD-10-CM | POA: Insufficient documentation

## 2015-10-16 DIAGNOSIS — Y929 Unspecified place or not applicable: Secondary | ICD-10-CM | POA: Diagnosis not present

## 2015-10-16 DIAGNOSIS — Y999 Unspecified external cause status: Secondary | ICD-10-CM | POA: Insufficient documentation

## 2015-10-16 DIAGNOSIS — Z791 Long term (current) use of non-steroidal anti-inflammatories (NSAID): Secondary | ICD-10-CM | POA: Insufficient documentation

## 2015-10-16 DIAGNOSIS — S8001XA Contusion of right knee, initial encounter: Secondary | ICD-10-CM | POA: Insufficient documentation

## 2015-10-16 DIAGNOSIS — S199XXA Unspecified injury of neck, initial encounter: Secondary | ICD-10-CM | POA: Diagnosis present

## 2015-10-16 DIAGNOSIS — S60229A Contusion of unspecified hand, initial encounter: Secondary | ICD-10-CM

## 2015-10-16 DIAGNOSIS — F329 Major depressive disorder, single episode, unspecified: Secondary | ICD-10-CM | POA: Diagnosis not present

## 2015-10-16 DIAGNOSIS — F17210 Nicotine dependence, cigarettes, uncomplicated: Secondary | ICD-10-CM | POA: Diagnosis not present

## 2015-10-16 DIAGNOSIS — Y9389 Activity, other specified: Secondary | ICD-10-CM | POA: Insufficient documentation

## 2015-10-16 DIAGNOSIS — S40011A Contusion of right shoulder, initial encounter: Secondary | ICD-10-CM

## 2015-10-16 DIAGNOSIS — S161XXA Strain of muscle, fascia and tendon at neck level, initial encounter: Secondary | ICD-10-CM

## 2015-10-16 MED ORDER — HYDROCODONE-ACETAMINOPHEN 5-325 MG PO TABS: 1.0000 | ORAL_TABLET | Freq: Four times a day (QID) | ORAL | Status: DC | PRN

## 2015-10-16 MED ORDER — HYDROCODONE-ACETAMINOPHEN 5-325 MG PO TABS
2.0000 | ORAL_TABLET | Freq: Once | ORAL | Status: AC
  Administered 2015-10-16: 2 via ORAL
  Filled 2015-10-16: qty 2
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
## 2015-10-16 NOTE — ED Notes (Signed)
Pt has become very anxious, want c- collar removed, states she is having SOB, pt is 100% on room air, took to a room

## 2015-10-16 NOTE — ED Notes (Signed)
EDP at bedside to remove C-Collar

## 2015-10-16 NOTE — ED Notes (Signed)
Patient verbalizes understanding of discharge instructions, prescription medications, home care and follow up care. Patient out of department at this time. 
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
## 2015-10-20 ENCOUNTER — Emergency Department (HOSPITAL_COMMUNITY)
Admission: EM | Admit: 2015-10-20 | Discharge: 2015-10-20 | Disposition: A | Discharge status: Home / Self Care | Payer: No Typology Code available for payment source | Attending: Emergency Medicine | Admitting: Emergency Medicine

## 2015-10-20 ENCOUNTER — Encounter (HOSPITAL_COMMUNITY): Payer: Self-pay | Admitting: *Deleted

## 2015-10-20 ENCOUNTER — Emergency Department (HOSPITAL_COMMUNITY): Payer: No Typology Code available for payment source

## 2015-10-20 DIAGNOSIS — S4991XA Unspecified injury of right shoulder and upper arm, initial encounter: Secondary | ICD-10-CM | POA: Insufficient documentation

## 2015-10-20 DIAGNOSIS — S4992XA Unspecified injury of left shoulder and upper arm, initial encounter: Secondary | ICD-10-CM | POA: Diagnosis not present

## 2015-10-20 DIAGNOSIS — M542 Cervicalgia: Secondary | ICD-10-CM

## 2015-10-20 DIAGNOSIS — Y998 Other external cause status: Secondary | ICD-10-CM | POA: Diagnosis not present

## 2015-10-20 DIAGNOSIS — Y9389 Activity, other specified: Secondary | ICD-10-CM | POA: Insufficient documentation

## 2015-10-20 DIAGNOSIS — Z8659 Personal history of other mental and behavioral disorders: Secondary | ICD-10-CM | POA: Insufficient documentation

## 2015-10-20 DIAGNOSIS — S199XXA Unspecified injury of neck, initial encounter: Secondary | ICD-10-CM | POA: Insufficient documentation

## 2015-10-20 DIAGNOSIS — Y92410 Unspecified street and highway as the place of occurrence of the external cause: Secondary | ICD-10-CM | POA: Diagnosis not present

## 2015-10-20 DIAGNOSIS — R202 Paresthesia of skin: Secondary | ICD-10-CM | POA: Insufficient documentation

## 2015-10-20 DIAGNOSIS — F17210 Nicotine dependence, cigarettes, uncomplicated: Secondary | ICD-10-CM | POA: Diagnosis not present

## 2015-10-20 MED ORDER — METHOCARBAMOL 500 MG PO TABS
500.0000 mg | ORAL_TABLET | Freq: Four times a day (QID) | ORAL | Status: DC | PRN
Start: 2015-10-20 — End: 2015-11-15

## 2015-10-20 MED ORDER — MELOXICAM 7.5 MG PO TABS: 7.5000 mg | ORAL_TABLET | Freq: Every day | ORAL | Status: DC | PRN

## 2015-10-20 MED ORDER — METHOCARBAMOL 500 MG PO TABS
1000.0000 mg | ORAL_TABLET | Freq: Once | ORAL | Status: AC
  Administered 2015-10-20: 1000 mg via ORAL
  Filled 2015-10-20: qty 2

## 2015-10-20 NOTE — ED Notes (Signed)
Pt is in stable condition upon d/c and ambulates from ED. 
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
## 2015-11-15 ENCOUNTER — Encounter (INDEPENDENT_AMBULATORY_CARE_PROVIDER_SITE_OTHER): Payer: Self-pay

## 2015-11-15 ENCOUNTER — Encounter: Payer: Self-pay | Admitting: Family

## 2015-11-15 ENCOUNTER — Ambulatory Visit (INDEPENDENT_AMBULATORY_CARE_PROVIDER_SITE_OTHER): Payer: Self-pay | Admitting: Family

## 2015-11-15 DIAGNOSIS — S161XXD Strain of muscle, fascia and tendon at neck level, subsequent encounter: Secondary | ICD-10-CM

## 2015-11-15 DIAGNOSIS — M5412 Radiculopathy, cervical region: Secondary | ICD-10-CM

## 2015-11-15 MED ORDER — METHOCARBAMOL 500 MG PO TABS: 500.0000 mg | ORAL_TABLET | Freq: Four times a day (QID) | ORAL | Status: DC | PRN

## 2015-11-15 MED ORDER — PREDNISONE 10 MG (21) PO TBPK: 10.0000 mg | ORAL_TABLET | Freq: Every day | ORAL | Status: DC

## 2015-11-15 MED ORDER — NAPROXEN 500 MG PO TABS: 500.0000 mg | ORAL_TABLET | Freq: Two times a day (BID) | ORAL | Status: DC
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
## 2015-11-15 NOTE — Patient Instructions (Addendum)
Cervical Radiculopathy Cervical radiculopathy happens when a nerve in the neck (cervical nerve) is pinched or bruised. This condition can develop because of an injury or as part of the normal aging process. Pressure on the cervical nerves can cause pain or numbness that runs from the neck all the way down into the arm and fingers. Usually, this condition gets better with rest. Treatment may be needed if the condition does not improve.  CAUSES This condition may be caused by:  Injury.  Slipped (herniated) disk.  Muscle tightness in the neck because of overuse.  Arthritis.  Breakdown or degeneration in the bones and joints of the spine (spondylosis) due to aging.  Bone spurs that may develop near the cervical nerves. SYMPTOMS Symptoms of this condition include:  Pain that runs from the neck to the arm and hand. The pain can be severe or irritating. It may be worse when the neck is moved.  Numbness or weakness in the affected arm and hand. DIAGNOSIS This condition may be diagnosed based on symptoms, medical history, and a physical exam. You may also have tests, including:  X-rays.  CT scan.  MRI.  Electromyogram (EMG).  Nerve conduction tests. TREATMENT In many cases, treatment is not needed for this condition. With rest, the condition usually gets better over time. If treatment is needed, options may include:  Wearing a soft neck collar for short periods of time.  Physical therapy to strengthen your neck muscles.  Medicines, such as NSAIDs, oral corticosteroids, or spinal injections.  Surgery. This may be needed if other treatments do not help. Various types of surgery may be done depending on the cause of your problems. HOME CARE INSTRUCTIONS Managing Pain  Take over-the-counter and prescription medicines only as told by your health care provider.  If directed, apply ice to the affected area.  Put ice in a plastic bag.  Place a towel between your skin and the  bag.  Leave the ice on for 20 minutes, 2-3 times per day.  If ice does not help, you can try using heat. Take a warm shower or warm bath, or use a heat pack as told by your health care provider.  Try a gentle neck and shoulder massage to help relieve symptoms. Activity  Rest as needed. Follow instructions from your health care provider about any restrictions on activities.  Do stretching and strengthening exercises as told by your health care provider or physical therapist. General Instructions  If you were given a soft collar, wear it as told by your health care provider.  Use a flat pillow when you sleep.  Keep all follow-up visits as told by your health care provider. This is important. SEEK MEDICAL CARE IF:  Your condition does not improve with treatment. SEEK IMMEDIATE MEDICAL CARE IF:  Your pain gets much worse and cannot be controlled with medicines.  You have weakness or numbness in your hand, arm, face, or leg.  You have a high fever.  You have a stiff, rigid neck.  You lose control of your bowels or your bladder (have incontinence).  You have trouble with walking, balance, or speaking.   This information is not intended to replace advice given to you by your health care provider. Make sure you discuss any questions you have with your health care provider.   Document Released: 03/19/2001 Document Revised: 03/15/2015 Document Reviewed: 08/18/2014 Elsevier Interactive Patient Education 2016 [employer] Cervical Sprain A cervical sprain is an injury in the neck in which the  strong, fibrous tissues (ligaments) that connect your neck bones stretch or tear. Cervical sprains can range from mild to severe. Severe cervical sprains can cause the neck vertebrae to be unstable. This can lead to damage of the spinal cord and can result in serious nervous system problems. The amount of time it takes for a cervical sprain to get better depends on the cause and extent of the  injury. Most cervical sprains heal in 1 to 3 weeks. CAUSES  Severe cervical sprains may be caused by:   Contact sport injuries (such as from football, rugby, wrestling, hockey, auto racing, gymnastics, diving, martial arts, or boxing).   Motor vehicle collisions.   Whiplash injuries. This is an injury from a sudden forward and backward whipping movement of the head and neck.  Falls.  Mild cervical sprains may be caused by:   Being in an awkward position, such as while cradling a telephone between your ear and shoulder.   Sitting in a chair that does not offer proper support.   Working at a poorly Marketing executivedesigned computer station.   Looking up or down for long periods of time.  SYMPTOMS   Pain, soreness, stiffness, or a burning sensation in the front, back, or sides of the neck. This discomfort may develop immediately after the injury or slowly, 24 hours or more after the injury.   Pain or tenderness directly in the middle of the back of the neck.   Shoulder or upper back pain.   Limited ability to move the neck.   Headache.   Dizziness.   Weakness, numbness, or tingling in the hands or arms.   Muscle spasms.   Difficulty swallowing or chewing.   Tenderness and swelling of the neck.  DIAGNOSIS  Most of the time your health care provider can diagnose a cervical sprain by taking your history and doing a physical exam. Your health care provider will ask about previous neck injuries and any known neck problems, such as arthritis in the neck. X-rays may be taken to find out if there are any other problems, such as with the bones of the neck. Other tests, such as a CT scan or MRI, may also be needed.  TREATMENT  Treatment depends on the severity of the cervical sprain. Mild sprains can be treated with rest, keeping the neck in place (immobilization), and pain medicines. Severe cervical sprains are immediately immobilized. Further treatment is done to help with pain,  muscle spasms, and other symptoms and may include:  Medicines, such as pain relievers, numbing medicines, or muscle relaxants.   Physical therapy. This may involve stretching exercises, strengthening exercises, and posture training. Exercises and improved posture can help stabilize the neck, strengthen muscles, and help stop symptoms from returning.  HOME CARE INSTRUCTIONS   Put ice on the injured area.   Put ice in a plastic bag.   Place a towel between your skin and the bag.   Leave the ice on for 15-20 minutes, 3-4 times a day.   If your injury was severe, you may have been given a cervical collar to wear. A cervical collar is a two-piece collar designed to keep your neck from moving while it heals.  Do not remove the collar unless instructed by your health care provider.  If you have long hair, keep it outside of the collar.  Ask your health care provider before making any adjustments to your collar. Minor adjustments may be required over time to improve comfort and reduce pressure on  your chin or on the back of your head.  Ifyou are allowed to remove the collar for cleaning or bathing, follow your health care provider's instructions on how to do so safely.  Keep your collar clean by wiping it with mild soap and water and drying it completely. If the collar you have been given includes removable pads, remove them every 1-2 days and hand wash them with soap and water. Allow them to air dry. They should be completely dry before you wear them in the collar.  If you are allowed to remove the collar for cleaning and bathing, wash and dry the skin of your neck. Check your skin for irritation or sores. If you see any, tell your health care provider.  Do not drive while wearing the collar.   Only take over-the-counter or prescription medicines for pain, discomfort, or fever as directed by your health care provider.   Keep all follow-up appointments as directed by your health  care provider.   Keep all physical therapy appointments as directed by your health care provider.   Make any needed adjustments to your workstation to promote good posture.   Avoid positions and activities that make your symptoms worse.   Warm up and stretch before being active to help prevent problems.  SEEK MEDICAL CARE IF:   Your pain is not controlled with medicine.   You are unable to decrease your pain medicine over time as planned.   Your activity level is not improving as expected.  SEEK IMMEDIATE MEDICAL CARE IF:   You develop any bleeding.  You develop stomach upset.  You have signs of an allergic reaction to your medicine.   Your symptoms get worse.   You develop new, unexplained symptoms.   You have numbness, tingling, weakness, or paralysis in any part of your body.  MAKE SURE YOU:   Understand these instructions.  Will watch your condition.  Will get help right away if you are not doing well or get worse.   This information is not intended to replace advice given to you by your health care provider. Make sure you discuss any questions you have with your health care provider.   Document Released: 04/21/2007 Document Revised: 06/29/2013 Document Reviewed: 12/30/2012 Elsevier Interactive Patient Education [employer]

## 2016-03-20 ENCOUNTER — Encounter: Payer: Self-pay | Admitting: Family Medicine

## 2016-03-20 ENCOUNTER — Ambulatory Visit (INDEPENDENT_AMBULATORY_CARE_PROVIDER_SITE_OTHER): Payer: Self-pay | Admitting: Family Medicine

## 2016-03-20 VITALS — BP 110/68 | HR 80 | Temp 97.5°F | Ht 62.0 in | Wt 143.2 lb

## 2016-03-20 DIAGNOSIS — S161XXD Strain of muscle, fascia and tendon at neck level, subsequent encounter: Secondary | ICD-10-CM

## 2016-03-20 MED ORDER — CYCLOBENZAPRINE HCL 10 MG PO TABS: 10.0000 mg | ORAL_TABLET | Freq: Three times a day (TID) | ORAL | 1 refills | Status: DC | PRN

## 2016-03-20 MED ORDER — DICLOFENAC SODIUM 75 MG PO TBEC: 75.0000 mg | DELAYED_RELEASE_TABLET | Freq: Two times a day (BID) | ORAL | 2 refills | Status: DC
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
## 2017-03-17 NOTE — Progress Notes (Signed)
Elizabeth [name] is a 41 y.o. female presents to office today to establish care and for annual physical exam examination with Pap smear.    Concerns today include: 1. Abnormal menstrual cycle Patient reports that she's been having abnormal menstrual cycles. She describes this as her. Being on for about 3 days, stopping, then restarting for another 3-4 days. She denies excessive cramping, abnormal vaginal discharge, belly pain, fevers, chills, dysuria. She is unsure as to when her mother went through menopause, as she had a hysterectomy for suspected cervical cancer in her mid 34s.  2. Anxiety She reports she used to be [facility] and was previously on Lexapro which she did not tolerate well. She identifies multiple stressors in her life, including her mother recently having moved in with her and work. She denies SI/HI. She reports poor sleep, citing that she has difficulty falling asleep. She relies on cigarettes and occasional marijuana use for relief of stress and anxiety. No depressive symptoms.  Occupation: Production designer, theatre/television/film at [employer], Marital status: married, Substance use: tob, mj Diet: liberal, Exercise: no structured  Last eye exam: every year  Last dental exam: >6 months Last pap smear: 2016 negative, no HPV co-testing performed. Immunizations needed: Tdap Vaccine: yes   Past Medical History:  Diagnosis Date  . Anxiety   . Depression    Social History   Social History  . Marital status: Married    Spouse name: N/A  . Number of children: N/A  . Years of education: N/A   Occupational History  . Not on file.   Social History Main Topics  . Smoking status: Current Every Day Smoker    Packs/day: 0.50    Years: 28.00    Types: Cigarettes  . Smokeless tobacco: Never Used  . Alcohol use Yes     Comment: occasionally  . Drug use: Yes    Frequency: 1.0 time per week    Types: Marijuana  . Sexual activity: Yes    Birth control/ protection: Surgical   Other Topics  Concern  . Not on file   Social History Narrative  . No narrative on file   Past Surgical History:  Procedure Laterality Date  . TUBAL LIGATION     Family History  Problem Relation Age of Onset  . Hyperlipidemia Mother   . Heart disease Mother   . Cancer Mother 29       cervical cancer  . Diabetes Father   . Drug abuse Brother        death 2/2 OD  . Cancer Maternal Grandmother        lung cancer  . Alzheimer's disease Maternal Grandfather     Current Outpatient Prescriptions:  .  hydrOXYzine (ATARAX/VISTARIL) 50 MG tablet, Take 1 tablet (50 mg total) by mouth 3 (three) times daily as needed for anxiety., Disp: 30 tablet, Rfl: 0   ROS: Review of Systems Constitutional: negative Eyes: positive for contacts/glasses Ears, nose, mouth, throat, and face: negative Respiratory: negative Cardiovascular: negative Gastrointestinal: negative Genitourinary:positive for occassional fishy odor with menstrual cycles Integument/breast: negative Hematologic/lymphatic: negative Musculoskeletal:positive for occ arthralgias Neurological: negative Behavioral/Psych: positive for anxiety Endocrine: negative Allergic/Immunologic: negative    Physical exam BP 104/75   Pulse 75   Temp (!) 97.2 F (36.2 C) (Oral)   Ht  (1.575 m)   Wt 143 lb (64.9 kg)   LMP 03/10/2017   BMI 26.16 kg/m  General appearance: alert, cooperative, appears stated age and no distress Head:  Normocephalic, without obvious abnormality, atraumatic Eyes: negative findings: lids and lashes normal, conjunctivae and sclerae normal, corneas clear and pupils equal, round, reactive to light and accomodation Ears: normal TM's and external ear canals both ears Nose: Nares normal. Septum midline. Mucosa normal. No drainage or sinus tenderness. Throat: lips, mucosa, and tongue normal; teeth and gums normal Neck: no adenopathy, no JVD, supple, symmetrical, trachea midline and thyroid not enlarged, symmetric, no  tenderness/mass/nodules Back: symmetric, no curvature. ROM normal. No CVA tenderness. Lungs: clear to auscultation bilaterally Breasts: normal appearance, no masses or tenderness, not inspected Heart: regular rate and rhythm, S1, S2 normal, no murmur, click, rub or gallop Abdomen: soft, non-tender; bowel sounds normal; no masses,  no organomegaly Pelvic: not performed Extremities: extremities normal, atraumatic, no cyanosis or edema Pulses: 2+ and symmetric Skin: Skin color, texture, turgor normal. No rashes or lesions Lymph nodes: Cervical, supraclavicular, and axillary nodes normal. Neurologic: Grossly normal   GAD 7 : Generalized Anxiety Score 03/18/2017  Nervous, Anxious, on Edge 3  Control/stop worrying 2  Worry too much - different things 2  Trouble relaxing 2  Restless 2  Easily annoyed or irritable 3  Afraid - awful might happen 2  Total GAD 7 Score 16  Anxiety Difficulty Somewhat difficult   Depression screen [facility] 2/9 03/18/2017 03/20/2016 11/11/2014  Decreased Interest - 0 0  Down, Depressed, Hopeless 0 0 0  PHQ - 2 Score 0 0 0     Assessment/ Plan: Timoteo Ace here for annual physical exam.   Overall, healthy female exam.  No focal findings on exam.  Anxiety GAD 7 score today was 16. She identifies multiple stressors in her life, including her mother recently having moved in with her and work. I discussed with her consideration for therapy versus initiation of medication. We'll start a when necessary medication for now and consider SSRI in the future. Her finances limit her from certain medications. For this reason I prescribed hydroxyzine 50 mg 3 times a day when necessary anxiety. I cautioned sedation, constipation, dry mouth. I advised to avoid other medications like Benadryl as able. She will follow up with me when necessary.  Tobacco use disorder Patient has been a half pack per day smoker for about 29 years. I did discuss with her the repercussions tobacco use  can have on overall health, including cardiovascular and cardiopulmonary implications. She had no red flag signs on cardiopulmonary exam. She is contemplative at this time. We reviewed various options for smoking cessation aids. Patient will likely wean herself off of cigarettes. Her goal is to reduce cigarette use by 1 cigarette per day until she is completely off of tobacco. She will follow up as needed for this.  Abnormal menstrual periods 2016 CBC and TSH reviewed. There are no abnormalities at that time. She is not exhibiting any other symptoms except for anxiety to suggest thyroid dysfunction. At this point, menstrual cycles are not interfering with her everyday life enough for her to pursue laboratory investigation of menstrual cycles. In the future, should she decide she would like to pursue further investigation, would consider repeat TSH, CBC +/-pelvic ultrasound. There is no evidence of anemia or thyroid abnormalities on exam.    Counseled on healthy lifestyle choices, including diet (rich in fruits, vegetables and lean meats and low in salt and simple carbohydrates) and exercise (at least 30 minutes of moderate physical activity daily).  Patient to follow up in 1 year for annual exam or sooner if needed.  Will  plan to obtain pap smear with HPV cotesting at that time.  Ashly M. Nadine CountsGottschalk, DO

## 2017-03-18 ENCOUNTER — Ambulatory Visit (INDEPENDENT_AMBULATORY_CARE_PROVIDER_SITE_OTHER): Payer: Self-pay | Admitting: Family Medicine

## 2017-03-18 ENCOUNTER — Encounter: Payer: Self-pay | Admitting: Family Medicine

## 2017-03-18 VITALS — BP 104/75 | HR 75 | Temp 97.2°F | Ht 62.0 in | Wt 143.0 lb

## 2017-03-18 DIAGNOSIS — Z0000 Encounter for general adult medical examination without abnormal findings: Secondary | ICD-10-CM

## 2017-03-18 DIAGNOSIS — F17200 Nicotine dependence, unspecified, uncomplicated: Secondary | ICD-10-CM

## 2017-03-18 DIAGNOSIS — N926 Irregular menstruation, unspecified: Secondary | ICD-10-CM

## 2017-03-18 DIAGNOSIS — F419 Anxiety disorder, unspecified: Secondary | ICD-10-CM

## 2017-03-18 DIAGNOSIS — Z7689 Persons encountering health services in other specified circumstances: Secondary | ICD-10-CM

## 2017-03-18 DIAGNOSIS — Z6826 Body mass index (BMI) 26.0-26.9, adult: Secondary | ICD-10-CM

## 2017-03-18 MED ORDER — HYDROXYZINE HCL 50 MG PO TABS: 50.0000 mg | ORAL_TABLET | Freq: Three times a day (TID) | ORAL | 0 refills | Status: DC | PRN

## 2017-03-18 NOTE — Assessment & Plan Note (Signed)
GAD 7 score today was 16. She identifies multiple stressors in her life, including her mother recently having moved in with her and work. I discussed with her consideration for therapy versus initiation of medication. We'll start a when necessary medication for now and consider SSRI in the future. Her finances limit her from certain medications. For this reason I prescribed hydroxyzine 50 mg 3 times a day when necessary anxiety. I cautioned sedation, constipation, dry mouth. I advised to avoid other medications like Benadryl as able. She will follow up with me when necessary.

## 2017-03-18 NOTE — Addendum Note (Signed)
Addended by: Raliegh IpGOTTSCHALK, Aleksandra Raben M on: 03/18/2017 12:50 PM   Modules accepted: Level of Service

## 2017-03-18 NOTE — Patient Instructions (Addendum)
I will contact you will the results of your labs via [place].  If anything is abnormal, I will call you.   Bacterial Vaginosis Bacterial vaginosis is a vaginal infection that occurs when the normal balance of bacteria in the vagina is disrupted. It results from an overgrowth of certain bacteria. This is the most common vaginal infection among women ages 52-44. Because bacterial vaginosis increases your risk for STIs (sexually transmitted infections), getting treated can help reduce your risk for chlamydia, gonorrhea, herpes, and HIV (human immunodeficiency virus). Treatment is also important for preventing complications in pregnant women, because this condition can cause an early (premature) delivery. What are the causes? This condition is caused by an increase in harmful bacteria that are normally present in small amounts in the vagina. However, the reason that the condition develops is not fully understood. What increases the risk? The following factors may make you more likely to develop this condition:  Having a new sexual partner or multiple sexual partners.  Having unprotected sex.  Douching.  Having an intrauterine device (IUD).  Smoking.  Drug and alcohol abuse.  Taking certain antibiotic medicines.  Being pregnant.  You cannot get bacterial vaginosis from toilet seats, bedding, swimming pools, or contact with objects around you. What are the signs or symptoms? Symptoms of this condition include:  Grey or white vaginal discharge. The discharge can also be watery or foamy.  A fish-like odor with discharge, especially after sexual intercourse or during menstruation.  Itching in and around the vagina.  Burning or pain with urination.  Some women with bacterial vaginosis have no signs or symptoms. How is this diagnosed? This condition is diagnosed based on:  Your medical history.  A physical exam of the vagina.  Testing a sample of vaginal fluid under a microscope  to look for a large amount of bad bacteria or abnormal cells. Your health care provider may use a cotton swab or a small wooden spatula to collect the sample.  How is this treated? This condition is treated with antibiotics. These may be given as a pill, a vaginal cream, or a medicine that is put into the vagina (suppository). If the condition comes back after treatment, a second round of antibiotics may be needed. Follow these instructions at home: Medicines  Take over-the-counter and prescription medicines only as told by your health care provider.  Take or use your antibiotic as told by your health care provider. Do not stop taking or using the antibiotic even if you start to feel better. General instructions  If you have a female sexual partner, tell her that you have a vaginal infection. She should see her health care provider and be treated if she has symptoms. If you have a female sexual partner, he does not need treatment.  During treatment: ? Avoid sexual activity until you finish treatment. ? Do not douche. ? Avoid alcohol as directed by your health care provider. ? Avoid breastfeeding as directed by your health care provider.  Drink enough water and fluids to keep your urine clear or pale yellow.  Keep the area around your vagina and rectum clean. ? Wash the area daily with warm water. ? Wipe yourself from front to back after using the toilet.  Keep all follow-up visits as told by your health care provider. This is important. How is this prevented?  Do not douche.  Wash the outside of your vagina with warm water only.  Use protection when having sex. This includes latex condoms and  dental dams.  Limit how many sexual partners you have. To help prevent bacterial vaginosis, it is best to have sex with just one partner (monogamous).  Make sure you and your sexual partner are tested for STIs.  Wear cotton or cotton-lined underwear.  Avoid wearing tight pants and  pantyhose, especially during summer.  Limit the amount of alcohol that you drink.  Do not use any products that contain nicotine or tobacco, such as cigarettes and e-cigarettes. If you need help quitting, ask your health care provider.  Do not use illegal drugs. Where to find more information:  Centers for Disease Control and Prevention: AppraiserFraud.fi  American Sexual Health Association (ASHA): www.ashastd.org  [place] Department of Health and Financial controller, Office on Women's Health: DustingSprays.pl or SecuritiesCard.it Contact a health care provider if:  Your symptoms do not improve, even after treatment.  You have more discharge or pain when urinating.  You have a fever.  You have pain in your abdomen.  You have pain during sex.  You have vaginal bleeding between periods. Summary  Bacterial vaginosis is a vaginal infection that occurs when the normal balance of bacteria in the vagina is disrupted.  Because bacterial vaginosis increases your risk for STIs (sexually transmitted infections), getting treated can help reduce your risk for chlamydia, gonorrhea, herpes, and HIV (human immunodeficiency virus). Treatment is also important for preventing complications in pregnant women, because the condition can cause an early (premature) delivery.  This condition is treated with antibiotic medicines. These may be given as a pill, a vaginal cream, or a medicine that is put into the vagina (suppository). This information is not intended to replace advice given to you by your health care provider. Make sure you discuss any questions you have with your health care provider. Document Released: 06/24/2005 Document Revised: 03/09/2016 Document Reviewed: 03/09/2016 Elsevier Interactive Patient Education  2017 [place] Syndrome Carpal tunnel syndrome is a condition that causes pain in your hand and arm. The carpal tunnel is a  narrow area located on the palm side of your wrist. Repeated wrist motion or certain diseases may cause swelling within the tunnel. This swelling pinches the main nerve in the wrist (median nerve). What are the causes? This condition may be caused by:  Repeated wrist motions.  Wrist injuries.  Arthritis.  A cyst or tumor in the carpal tunnel.  Fluid buildup during pregnancy.  Sometimes the cause of this condition is not known. What increases the risk? This condition is more likely to develop in:  People who have jobs that cause them to repeatedly move their wrists in the same motion, such as Art gallery manager.  Women.  People with certain conditions, such as: ? Diabetes. ? Obesity. ? An underactive thyroid (hypothyroidism). ? Kidney failure.  What are the signs or symptoms? Symptoms of this condition include:  A tingling feeling in your fingers, especially in your thumb, index, and middle fingers.  Tingling or numbness in your hand.  An aching feeling in your entire arm, especially when your wrist and elbow are bent for long periods of time.  Wrist pain that goes up your arm to your shoulder.  Pain that goes down into your palm or fingers.  A weak feeling in your hands. You may have trouble grabbing and holding items.  Your symptoms may feel worse during the night. How is this diagnosed? This condition is diagnosed with a medical history and physical exam. You may also have tests, including:  An  electromyogram (EMG). This test measures electrical signals sent by your nerves into the muscles.  X-rays.  How is this treated? Treatment for this condition includes:  Lifestyle changes. It is important to stop doing or modify the activity that caused your condition.  Physical or occupational therapy.  Medicines for pain and inflammation. This may include medicine that is injected into your wrist.  A wrist splint.  Surgery.  Follow these instructions at  home: If you have a splint:  Wear it as told by your health care provider. Remove it only as told by your health care provider.  Loosen the splint if your fingers become numb and tingle, or if they turn cold and blue.  Keep the splint clean and dry. General instructions  Take over-the-counter and prescription medicines only as told by your health care provider.  Rest your wrist from any activity that may be causing your pain. If your condition is work related, talk to your employer about changes that can be made, such as getting a wrist pad to use while typing.  If directed, apply ice to the painful area: ? Put ice in a plastic bag. ? Place a towel between your skin and the bag. ? Leave the ice on for 20 minutes, 2-3 times per day.  Keep all follow-up visits as told by your health care provider. This is important.  Do any exercises as told by your health care provider, physical therapist, or occupational therapist. Contact a health care provider if:  You have new symptoms.  Your pain is not controlled with medicines.  Your symptoms get worse. This information is not intended to replace advice given to you by your health care provider. Make sure you discuss any questions you have with your health care provider. Document Released: 06/21/2000 Document Revised: 11/02/2015 Document Reviewed: 11/09/2014 Elsevier Interactive Patient Education  2017 [place] Maintenance, Female Adopting a healthy lifestyle and getting preventive care can go a long way to promote health and wellness. Talk with your health care provider about what schedule of regular examinations is right for you. This is a good chance for you to check in with your provider about disease prevention and staying healthy. In between checkups, there are plenty of things you can do on your own. Experts have done a lot of research about which lifestyle changes and preventive measures are most likely to keep you  healthy. Ask your health care provider for more information. Weight and diet Eat a healthy diet  Be sure to include plenty of vegetables, fruits, low-fat dairy products, and lean protein.  Do not eat a lot of foods high in solid fats, added sugars, or salt.  Get regular exercise. This is one of the most important things you can do for your health. ? Most adults should exercise for at least 150 minutes each week. The exercise should increase your heart rate and make you sweat (moderate-intensity exercise). ? Most adults should also do strengthening exercises at least twice a week. This is in addition to the moderate-intensity exercise.  Maintain a healthy weight  Body mass index (BMI) is a measurement that can be used to identify possible weight problems. It estimates body fat based on height and weight. Your health care provider can help determine your BMI and help you achieve or maintain a healthy weight.  For females 18 years of age and older: ? A BMI below 18.5 is considered underweight. ? A BMI of 18.5 to 24.9 is  normal. ? A BMI of 25 to 29.9 is considered overweight. ? A BMI of 30 and above is considered obese.  Watch levels of cholesterol and blood lipids  You should start having your blood tested for lipids and cholesterol at 39 years of age, then have this test every 5 years.  You may need to have your cholesterol levels checked more often if: ? Your lipid or cholesterol levels are high. ? You are older than 39 years of age. ? You are at high risk for heart disease.  Cancer screening Lung Cancer  Lung cancer screening is recommended for adults 48-23 years old who are at high risk for lung cancer because of a history of smoking.  A yearly low-dose CT scan of the lungs is recommended for people who: ? Currently smoke. ? Have quit within the past 15 years. ? Have at least a 30-pack-year history of smoking. A pack year is smoking an average of one pack of cigarettes a day  for 1 year.  Yearly screening should continue until it has been 15 years since you quit.  Yearly screening should stop if you develop a health problem that would prevent you from having lung cancer treatment.  Breast Cancer  Practice breast self-awareness. This means understanding how your breasts normally appear and feel.  It also means doing regular breast self-exams. Let your health care provider know about any changes, no matter how small.  If you are in your 20s or 30s, you should have a clinical breast exam (CBE) by a health care provider every 1-3 years as part of a regular health exam.  If you are 81 or older, have a CBE every year. Also consider having a breast X-ray (mammogram) every year.  If you have a family history of breast cancer, talk to your health care provider about genetic screening.  If you are at high risk for breast cancer, talk to your health care provider about having an MRI and a mammogram every year.  Breast cancer gene (BRCA) assessment is recommended for women who have family members with BRCA-related cancers. BRCA-related cancers include: ? Breast. ? Ovarian. ? Tubal. ? Peritoneal cancers.  Results of the assessment will determine the need for genetic counseling and BRCA1 and BRCA2 testing.  Cervical Cancer Your health care provider may recommend that you be screened regularly for cancer of the pelvic organs (ovaries, uterus, and vagina). This screening involves a pelvic examination, including checking for microscopic changes to the surface of your cervix (Pap test). You may be encouraged to have this screening done every 3 years, beginning at age 77.  For women ages 89-65, health care providers may recommend pelvic exams and Pap testing every 3 years, or they may recommend the Pap and pelvic exam, combined with testing for human papilloma virus (HPV), every 5 years. Some types of HPV increase your risk of cervical cancer. Testing for HPV may also be done  on women of any age with unclear Pap test results.  Other health care providers may not recommend any screening for nonpregnant women who are considered low risk for pelvic cancer and who do not have symptoms. Ask your health care provider if a screening pelvic exam is right for you.  If you have had past treatment for cervical cancer or a condition that could lead to cancer, you need Pap tests and screening for cancer for at least 20 years after your treatment. If Pap tests have been discontinued, your risk factors (such as  having a new sexual partner) need to be reassessed to determine if screening should resume. Some women have medical problems that increase the chance of getting cervical cancer. In these cases, your health care provider may recommend more frequent screening and Pap tests.  Colorectal Cancer  This type of cancer can be detected and often prevented.  Routine colorectal cancer screening usually begins at 39 years of age and continues through 39 years of age.  Your health care provider may recommend screening at an earlier age if you have risk factors for colon cancer.  Your health care provider may also recommend using home test kits to check for hidden blood in the stool.  A small camera at the end of a tube can be used to examine your colon directly (sigmoidoscopy or colonoscopy). This is done to check for the earliest forms of colorectal cancer.  Routine screening usually begins at age 59.  Direct examination of the colon should be repeated every 5-10 years through 39 years of age. However, you may need to be screened more often if early forms of precancerous polyps or small growths are found.  Skin Cancer  Check your skin from head to toe regularly.  Tell your health care provider about any new moles or changes in moles, especially if there is a change in a mole's shape or color.  Also tell your health care provider if you have a mole that is larger than the size of  a pencil eraser.  Always use sunscreen. Apply sunscreen liberally and repeatedly throughout the day.  Protect yourself by wearing long sleeves, pants, a wide-brimmed hat, and sunglasses whenever you are outside.  Heart disease, diabetes, and high blood pressure  High blood pressure causes heart disease and increases the risk of stroke. High blood pressure is more likely to develop in: ? People who have blood pressure in the high end of the normal range (130-139/85-89 mm Hg). ? People who are overweight or obese. ? People who are African American.  If you are 60-66 years of age, have your blood pressure checked every 3-5 years. If you are 29 years of age or older, have your blood pressure checked every year. You should have your blood pressure measured twice-once when you are at a hospital or clinic, and once when you are not at a hospital or clinic. Record the average of the two measurements. To check your blood pressure when you are not at a hospital or clinic, you can use: ? An automated blood pressure machine at a pharmacy. ? A home blood pressure monitor.  If you are between 5 years and 69 years old, ask your health care provider if you should take aspirin to prevent strokes.  Have regular diabetes screenings. This involves taking a blood sample to check your fasting blood sugar level. ? If you are at a normal weight and have a low risk for diabetes, have this test once every three years after 39 years of age. ? If you are overweight and have a high risk for diabetes, consider being tested at a younger age or more often. Preventing infection Hepatitis B  If you have a higher risk for hepatitis B, you should be screened for this virus. You are considered at high risk for hepatitis B if: ? You were born in a country where hepatitis B is common. Ask your health care provider which countries are considered high risk. ? Your parents were born in a high-risk country, and you have not  been  immunized against hepatitis B (hepatitis B vaccine). ? You have HIV or AIDS. ? You use needles to inject street drugs. ? You live with someone who has hepatitis B. ? You have had sex with someone who has hepatitis B. ? You get hemodialysis treatment. ? You take certain medicines for conditions, including cancer, organ transplantation, and autoimmune conditions.  Hepatitis C  Blood testing is recommended for: ? Everyone born from [birth year] through 1963. ? Anyone with known risk factors for hepatitis C.  Sexually transmitted infections (STIs)  You should be screened for sexually transmitted infections (STIs) including gonorrhea and chlamydia if: ? You are sexually active and are younger than 39 years of age. ? You are older than 39 years of age and your health care provider tells you that you are at risk for this type of infection. ? Your sexual activity has changed since you were last screened and you are at an increased risk for chlamydia or gonorrhea. Ask your health care provider if you are at risk.  If you do not have HIV, but are at risk, it may be recommended that you take a prescription medicine daily to prevent HIV infection. This is called pre-exposure prophylaxis (PrEP). You are considered at risk if: ? You are sexually active and do not regularly use condoms or know the HIV status of your partner(s). ? You take drugs by injection. ? You are sexually active with a partner who has HIV.  Talk with your health care provider about whether you are at high risk of being infected with HIV. If you choose to begin PrEP, you should first be tested for HIV. You should then be tested every 3 months for as long as you are taking PrEP. Pregnancy  If you are premenopausal and you may become pregnant, ask your health care provider about preconception counseling.  If you may become pregnant, take 400 to 800 micrograms (mcg) of folic acid every day.  If you want to prevent pregnancy, talk to your  health care provider about birth control (contraception). Osteoporosis and menopause  Osteoporosis is a disease in which the bones lose minerals and strength with aging. This can result in serious bone fractures. Your risk for osteoporosis can be identified using a bone density scan.  If you are 66 years of age or older, or if you are at risk for osteoporosis and fractures, ask your health care provider if you should be screened.  Ask your health care provider whether you should take a calcium or vitamin D supplement to lower your risk for osteoporosis.  Menopause may have certain physical symptoms and risks.  Hormone replacement therapy may reduce some of these symptoms and risks. Talk to your health care provider about whether hormone replacement therapy is right for you. Follow these instructions at home:  Schedule regular health, dental, and eye exams.  Stay current with your immunizations.  Do not use any tobacco products including cigarettes, chewing tobacco, or electronic cigarettes.  If you are pregnant, do not drink alcohol.  If you are breastfeeding, limit how much and how often you drink alcohol.  Limit alcohol intake to no more than 1 drink per day for nonpregnant women. One drink equals 12 ounces of beer, 5 ounces of wine, or 1 ounces of hard liquor.  Do not use street drugs.  Do not share needles.  Ask your health care provider for help if you need support or information about quitting drugs.  Tell your health care  provider if you often feel depressed.  Tell your health care provider if you have ever been abused or do not feel safe at home. This information is not intended to replace advice given to you by your health care provider. Make sure you discuss any questions you have with your health care provider. Document Released: 01/07/2011 Document Revised: 11/30/2015 Document Reviewed: 03/28/2015 Elsevier Interactive Patient Education  2018 [employer].      Tobacco Use Disorder Tobacco use disorder (TUD) is a mental disorder. It is the long-term use of tobacco in spite of related health problems or difficulty with normal life activities. Tobacco is most commonly smoked as cigarettes and less commonly as cigars or pipes. Smokeless chewing tobacco and snuff are also popular. People with TUD get a feeling of extreme pleasure (euphoria) from using tobacco and have a desire to use it again and again. Repeated use of tobacco can cause problems. The addictive effects of tobacco are due mainly tothe ingredient nicotine. Nicotine also causes a rush of adrenaline (epinephrine) in the body. This leads to increased blood pressure, heart rate, and breathing rate. These changes may cause problems for people with high blood pressure, weak hearts, or lung disease. High doses of nicotine in children and pets can lead to seizures and death. Tobacco contains a number of other unsafe chemicals. These chemicals are especially harmful when inhaled as smoke and can damage almost every organ in the body. Smokers live shorter lives than nonsmokers and are at risk of dying from a number of diseases and cancers. Tobacco smoke can also cause health problems for nonsmokers (due to inhaling secondhand smoke). Smoking is also a fire hazard. TUD usually starts in the late teenage years and is most common in young adults between the ages of 48 and 24 years. People who start smoking earlier in life are more likely to continue smoking as adults. TUD is somewhat more common in men than women. People with TUD are at higher risk for using alcohol and other drugs of abuse. What increases the risk? Risk factors for TUD include:  Having family members with the disorder.  Being around people who use tobacco.  Having an existing mental health issue such as schizophrenia, depression, bipolar disorder, ADHD, or posttraumatic stress disorder (PTSD).  What are the signs or symptoms? People with  tobacco use disorder have two or more of the following signs and symptoms within 12 months:  Use of more tobacco over a longer period than intended.  Not able to cut down or control tobacco use.  A lot of time spent obtaining or using tobacco.  Strong desire or urge to use tobacco (craving). Cravings may last for 6 months or longer after quitting.  Use of tobacco even when use leads to major problems at work, school, or home.  Use of tobacco even when use leads to relationship problems.  Giving up or cutting down on important life activities because of tobacco use.  Repeatedly using tobacco in situations where it puts you or others in physical danger, like smoking in bed.  Use of tobacco even when it is known that a physical or mental problem is likely related to tobacco use. ? Physical problems are numerous and may include chronic bronchitis, emphysema, lung and other cancers, gum disease, high blood pressure, heart disease, and stroke. ? Mental problems caused by tobacco may include difficulty sleeping and anxiety.  Need to use greater amounts of tobacco to get the same effect. This means you have  developed a tolerance.  Withdrawal symptoms as a result of stopping or rapidly cutting back use. These symptoms may last a month or more after quitting and include the following: ? Depressed, anxious, or irritable mood. ? Difficulty concentrating. ? Increased appetite. ? Restlessness or trouble sleeping. ? Use of tobacco to avoid withdrawal symptoms.  How is this diagnosed? Tobacco use disorder is diagnosed by your health care provider. A diagnosis may be made by:  Your health care provider asking questions about your tobacco use and any problems it may be causing.  A physical exam.  Lab tests.  You may be referred to a mental health professional or addiction specialist.  The severity of tobacco use disorder depends on the number of signs and symptoms you have:  Mild-Two or  three symptoms.  Moderate-Four or five symptoms.  Severe-Six or more symptoms.  How is this treated? Many people with tobacco use disorder are unable to quit on their own and need help. Treatment options include the following:  Nicotine replacement therapy (NRT). NRT provides nicotine without the other harmful chemicals in tobacco. NRT gradually lowers the dosage of nicotine in the body and reduces withdrawal symptoms. NRT is available in over-the-counter forms (gum, lozenges, and skin patches) as well as prescription forms (mouth inhaler and nasal spray).  Medicines.This may include: ? Antidepressant medicine that may reduce nicotine cravings. ? A medicine that acts on nicotine receptors in the brain to reduce cravings and withdrawal symptoms. It may also block the effects of tobacco in people with TUD who relapse.  Counseling or talk therapy. A form of talk therapy called behavioral therapy is commonly used to treat people with TUD. Behavioral therapy looks at triggers for tobacco use, how to avoid them, and how to cope with cravings. It is most effective in person or by phone but is also available in self-help forms (books and Internet websites).  Support groups. These provide emotional support, advice, and guidance for quitting tobacco.  The most effective treatment for TUD is usually a combination of medicine, talk therapy, and support groups. Follow these instructions at home:  Keep all follow-up visits as directed by your health care provider. This is important.  Take medicines only as directed by your health care provider.  Check with your health care provider before starting new prescription or over-the-counter medicines. Contact a health care provider if:  You are not able to take your medicines as prescribed.  Treatment is not helping your TUD and your symptoms get worse. Get help right away if:  You have serious thoughts about hurting yourself or others.  You have  trouble breathing, chest pain, sudden weakness, or sudden numbness in part of your body. This information is not intended to replace advice given to you by your health care provider. Make sure you discuss any questions you have with your health care provider. Document Released: 02/28/2004 Document Revised: 02/25/2016 Document Reviewed: 08/20/2013 Elsevier Interactive Patient Education  [employer].

## 2017-03-18 NOTE — Assessment & Plan Note (Signed)
2016 CBC and TSH reviewed. There are no abnormalities at that time. She is not exhibiting any other symptoms except for anxiety to suggest thyroid dysfunction. At this point, menstrual cycles are not interfering with her everyday life enough for her to pursue laboratory investigation of menstrual cycles. In the future, should she decide she would like to pursue further investigation, would consider repeat TSH, CBC +/-pelvic ultrasound. There is no evidence of anemia or thyroid abnormalities on exam.

## 2017-03-18 NOTE — Assessment & Plan Note (Signed)
Patient has been a half pack per day smoker for about 29 years. I did discuss with her the repercussions tobacco use can have on overall health, including cardiovascular and cardiopulmonary implications. She had no red flag signs on cardiopulmonary exam. She is contemplative at this time. We reviewed various options for smoking cessation aids. Patient will likely wean herself off of cigarettes. Her goal is to reduce cigarette use by 1 cigarette per day until she is completely off of tobacco. She will follow up as needed for this.

## 2018-01-21 ENCOUNTER — Encounter: Payer: Self-pay | Admitting: Family Medicine

## 2018-01-21 ENCOUNTER — Ambulatory Visit (INDEPENDENT_AMBULATORY_CARE_PROVIDER_SITE_OTHER): Payer: PRIVATE HEALTH INSURANCE | Admitting: Family Medicine

## 2018-01-21 VITALS — BP 101/73 | HR 82 | Temp 97.8°F | Ht 62.0 in | Wt 145.0 lb

## 2018-01-21 DIAGNOSIS — Z6826 Body mass index (BMI) 26.0-26.9, adult: Secondary | ICD-10-CM | POA: Diagnosis not present

## 2018-01-21 DIAGNOSIS — E559 Vitamin D deficiency, unspecified: Secondary | ICD-10-CM

## 2018-01-21 DIAGNOSIS — N926 Irregular menstruation, unspecified: Secondary | ICD-10-CM

## 2018-01-21 DIAGNOSIS — F17200 Nicotine dependence, unspecified, uncomplicated: Secondary | ICD-10-CM

## 2018-01-21 DIAGNOSIS — Z2821 Immunization not carried out because of patient refusal: Secondary | ICD-10-CM

## 2018-01-21 DIAGNOSIS — Z01419 Encounter for gynecological examination (general) (routine) without abnormal findings: Secondary | ICD-10-CM

## 2018-01-21 DIAGNOSIS — Z13220 Encounter for screening for lipoid disorders: Secondary | ICD-10-CM

## 2018-01-21 DIAGNOSIS — Z124 Encounter for screening for malignant neoplasm of cervix: Secondary | ICD-10-CM

## 2018-01-21 NOTE — Patient Instructions (Addendum)
You had labs performed today.  You will be contacted with the results of the labs once they are available, usually in the next 3 business days for routine lab work.   If you had a pap smear or biopsy performed, expect to be contacted in about 7-10 days.  Schedule your mammogram when you check out today.  Health Maintenance, Female Adopting a healthy lifestyle and getting preventive care can go a long way to promote health and wellness. Talk with your health care provider about what schedule of regular examinations is right for you. This is a good chance for you to check in with your provider about disease prevention and staying healthy. In between checkups, there are plenty of things you can do on your own. Experts have done a lot of research about which lifestyle changes and preventive measures are most likely to keep you healthy. Ask your health care provider for more information. Weight and diet Eat a healthy diet  Be sure to include plenty of vegetables, fruits, low-fat dairy products, and lean protein.  Do not eat a lot of foods high in solid fats, added sugars, or salt.  Get regular exercise. This is one of the most important things you can do for your health. ? Most adults should exercise for at least 150 minutes each week. The exercise should increase your heart rate and make you sweat (moderate-intensity exercise). ? Most adults should also do strengthening exercises at least twice a week. This is in addition to the moderate-intensity exercise.  Maintain a healthy weight  Body mass index (BMI) is a measurement that can be used to identify possible weight problems. It estimates body fat based on height and weight. Your health care provider can help determine your BMI and help you achieve or maintain a healthy weight.  For females 25 years of age and older: ? A BMI below 18.5 is considered underweight. ? A BMI of 18.5 to 24.9 is normal. ? A BMI of 25 to 29.9 is considered  overweight. ? A BMI of 30 and above is considered obese.  Watch levels of cholesterol and blood lipids  You should start having your blood tested for lipids and cholesterol at 40 years of age, then have this test every 5 years.  You may need to have your cholesterol levels checked more often if: ? Your lipid or cholesterol levels are high. ? You are older than 40 years of age. ? You are at high risk for heart disease.  Cancer screening Lung Cancer  Lung cancer screening is recommended for adults 29-4 years old who are at high risk for lung cancer because of a history of smoking.  A yearly low-dose CT scan of the lungs is recommended for people who: ? Currently smoke. ? Have quit within the past 15 years. ? Have at least a 30-pack-year history of smoking. A pack year is smoking an average of one pack of cigarettes a day for 1 year.  Yearly screening should continue until it has been 15 years since you quit.  Yearly screening should stop if you develop a health problem that would prevent you from having lung cancer treatment.  Breast Cancer  Practice breast self-awareness. This means understanding how your breasts normally appear and feel.  It also means doing regular breast self-exams. Let your health care provider know about any changes, no matter how small.  If you are in your 20s or 30s, you should have a clinical breast exam (CBE) by a  health care provider every 1-3 years as part of a regular health exam.  If you are 13 or older, have a CBE every year. Also consider having a breast X-ray (mammogram) every year.  If you have a family history of breast cancer, talk to your health care provider about genetic screening.  If you are at high risk for breast cancer, talk to your health care provider about having an MRI and a mammogram every year.  Breast cancer gene (BRCA) assessment is recommended for women who have family members with BRCA-related cancers. BRCA-related cancers  include: ? Breast. ? Ovarian. ? Tubal. ? Peritoneal cancers.  Results of the assessment will determine the need for genetic counseling and BRCA1 and BRCA2 testing.  Cervical Cancer Your health care provider may recommend that you be screened regularly for cancer of the pelvic organs (ovaries, uterus, and vagina). This screening involves a pelvic examination, including checking for microscopic changes to the surface of your cervix (Pap test). You may be encouraged to have this screening done every 3 years, beginning at age 39.  For women ages 21-65, health care providers may recommend pelvic exams and Pap testing every 3 years, or they may recommend the Pap and pelvic exam, combined with testing for human papilloma virus (HPV), every 5 years. Some types of HPV increase your risk of cervical cancer. Testing for HPV may also be done on women of any age with unclear Pap test results.  Other health care providers may not recommend any screening for nonpregnant women who are considered low risk for pelvic cancer and who do not have symptoms. Ask your health care provider if a screening pelvic exam is right for you.  If you have had past treatment for cervical cancer or a condition that could lead to cancer, you need Pap tests and screening for cancer for at least 20 years after your treatment. If Pap tests have been discontinued, your risk factors (such as having a new sexual partner) need to be reassessed to determine if screening should resume. Some women have medical problems that increase the chance of getting cervical cancer. In these cases, your health care provider may recommend more frequent screening and Pap tests.  Colorectal Cancer  This type of cancer can be detected and often prevented.  Routine colorectal cancer screening usually begins at 40 years of age and continues through 39 years of age.  Your health care provider may recommend screening at an earlier age if you have risk factors  for colon cancer.  Your health care provider may also recommend using home test kits to check for hidden blood in the stool.  A small camera at the end of a tube can be used to examine your colon directly (sigmoidoscopy or colonoscopy). This is done to check for the earliest forms of colorectal cancer.  Routine screening usually begins at age 58.  Direct examination of the colon should be repeated every 5-10 years through 40 years of age. However, you may need to be screened more often if early forms of precancerous polyps or small growths are found.  Skin Cancer  Check your skin from head to toe regularly.  Tell your health care provider about any new moles or changes in moles, especially if there is a change in a mole's shape or color.  Also tell your health care provider if you have a mole that is larger than the size of a pencil eraser.  Always use sunscreen. Apply sunscreen liberally and repeatedly throughout  Protect yourself by wearing long sleeves, pants, a wide-brimmed hat, and sunglasses whenever you are outside.  Heart disease, diabetes, and high blood pressure  High blood pressure causes heart disease and increases the risk of stroke. High blood pressure is more likely to develop in: ? People who have blood pressure in the high end of the normal range (130-139/85-89 mm Hg). ? People who are overweight or obese. ? People who are African American.  If you are 18-39 years of age, have your blood pressure checked every 3-5 years. If you are 40 years of age or older, have your blood pressure checked every year. You should have your blood pressure measured twice-once when you are at a hospital or clinic, and once when you are not at a hospital or clinic. Record the average of the two measurements. To check your blood pressure when you are not at a hospital or clinic, you can use: ? An automated blood pressure machine at a pharmacy. ? A home blood pressure monitor.  If  you are between 55 years and 79 years old, ask your health care provider if you should take aspirin to prevent strokes.  Have regular diabetes screenings. This involves taking a blood sample to check your fasting blood sugar level. ? If you are at a normal weight and have a low risk for diabetes, have this test once every three years after 40 years of age. ? If you are overweight and have a high risk for diabetes, consider being tested at a younger age or more often. Preventing infection Hepatitis B  If you have a higher risk for hepatitis B, you should be screened for this virus. You are considered at high risk for hepatitis B if: ? You were born in a country where hepatitis B is common. Ask your health care provider which countries are considered high risk. ? Your parents were born in a high-risk country, and you have not been immunized against hepatitis B (hepatitis B vaccine). ? You have HIV or AIDS. ? You use needles to inject street drugs. ? You live with someone who has hepatitis B. ? You have had sex with someone who has hepatitis B. ? You get hemodialysis treatment. ? You take certain medicines for conditions, including cancer, organ transplantation, and autoimmune conditions.  Hepatitis C  Blood testing is recommended for: ? Everyone born from 1945 through 1965. ? Anyone with known risk factors for hepatitis C.  Sexually transmitted infections (STIs)  You should be screened for sexually transmitted infections (STIs) including gonorrhea and chlamydia if: ? You are sexually active and are younger than 40 years of age. ? You are older than 40 years of age and your health care provider tells you that you are at risk for this type of infection. ? Your sexual activity has changed since you were last screened and you are at an increased risk for chlamydia or gonorrhea. Ask your health care provider if you are at risk.  If you do not have HIV, but are at risk, it may be recommended  that you take a prescription medicine daily to prevent HIV infection. This is called pre-exposure prophylaxis (PrEP). You are considered at risk if: ? You are sexually active and do not regularly use condoms or know the HIV status of your partner(s). ? You take drugs by injection. ? You are sexually active with a partner who has HIV.  Talk with your health care provider about whether you are at high risk of   high risk of being infected with HIV. If you choose to begin PrEP, you should first be tested for HIV. You should then be tested every 3 months for as long as you are taking PrEP. Pregnancy  If you are premenopausal and you may become pregnant, ask your health care provider about preconception counseling.  If you may become pregnant, take 400 to 800 micrograms (mcg) of folic acid every day.  If you want to prevent pregnancy, talk to your health care provider about birth control (contraception). Osteoporosis and menopause  Osteoporosis is a disease in which the bones lose minerals and strength with aging. This can result in serious bone fractures. Your risk for osteoporosis can be identified using a bone density scan.  If you are 5 years of age or older, or if you are at risk for osteoporosis and fractures, ask your health care provider if you should be screened.  Ask your health care provider whether you should take a calcium or vitamin D supplement to lower your risk for osteoporosis.  Menopause may have certain physical symptoms and risks.  Hormone replacement therapy may reduce some of these symptoms and risks. Talk to your health care provider about whether hormone replacement therapy is right for you. Follow these instructions at home:  Schedule regular health, dental, and eye exams.  Stay current with your immunizations.  Do not use any tobacco products including cigarettes, chewing tobacco, or electronic cigarettes.  If you are pregnant, do not drink alcohol.  If you are  breastfeeding, limit how much and how often you drink alcohol.  Limit alcohol intake to no more than 1 drink per day for nonpregnant women. One drink equals 12 ounces of beer, 5 ounces of wine, or 1 ounces of hard liquor.  Do not use street drugs.  Do not share needles.  Ask your health care provider for help if you need support or information about quitting drugs.  Tell your health care provider if you often feel depressed.  Tell your health care provider if you have ever been abused or do not feel safe at home. This information is not intended to replace advice given to you by your health care provider. Make sure you discuss any questions you have with your health care provider. Document Released: 01/07/2011 Document Revised: 11/30/2015 Document Reviewed: 03/28/2015 Elsevier Interactive Patient Education  [employer].

## 2018-01-21 NOTE — Progress Notes (Signed)
Elizabeth [name] is a 42 y.o. female presents to office today for annual physical exam examination.    Concerns today include: 1. Wants to be screened for cancers if possible.  She reports strong family history of cancers and wants to have anything done that is able to be done while she has insurance.  Occupation: Freight forwarder at [employer] in [place], Marital status: Has been married for 19 years.  They just celebrated their anniversary, Substance use: Less than 1 pack/day of cigarettes. Diet: Fair.  She does eat Elizabeth [name] sometimes., Exercise: Active at work Last pap smear: 11/2014, negative; no cotesting performed Refills needed today: TDap declined  Past Medical History:  Diagnosis Date  . Anxiety   . Depression    Social History   Socioeconomic History  . Marital status: Married    Spouse name: Not on file  . Number of children: Not on file  . Years of education: Not on file  . Highest education level: Not on file  Occupational History  . Not on file  Social Needs  . Financial resource strain: Not on file  . Food insecurity:    Worry: Not on file    Inability: Not on file  . Transportation needs:    Medical: Not on file    Non-medical: Not on file  Tobacco Use  . Smoking status: Current Every Day Smoker    Packs/day: 0.50    Years: 28.00    Pack years: 14.00    Types: Cigarettes  . Smokeless tobacco: Never Used  Substance and Sexual Activity  . Alcohol use: Yes    Comment: occasionally  . Drug use: Yes    Frequency: 1.0 times per week    Types: Marijuana  . Sexual activity: Yes    Birth control/protection: Surgical  Lifestyle  . Physical activity:    Days per week: Not on file    Minutes per session: Not on file  . Stress: Not on file  Relationships  . Social connections:    Talks on phone: Not on file    Gets together: Not on file    Attends religious service: Not on file    Active member of club or organization: Not on file    Attends meetings of clubs  or organizations: Not on file    Relationship status: Not on file  . Intimate partner violence:    Fear of current or ex partner: Not on file    Emotionally abused: Not on file    Physically abused: Not on file    Forced sexual activity: Not on file  Other Topics Concern  . Not on file  Social History Narrative  . Not on file   Past Surgical History:  Procedure Laterality Date  . TUBAL LIGATION     Family History  Problem Relation Age of Onset  . Hyperlipidemia Mother   . Heart disease Mother   . Cancer Mother 54       ovarian cancer  . Ovarian cancer Mother   . Diabetes Father   . Drug abuse Brother        death 2/2 OD  . Cancer Maternal Grandmother        lung cancer  . Alzheimer's disease Maternal Grandfather   . Cervical cancer Maternal Aunt   . Breast cancer Maternal Aunt 45   No current outpatient medications on file.  No Known Allergies   ROS: Review of Systems Constitutional: negative Eyes: negative Ears, nose, mouth,  throat, and face: negative Respiratory: negative Cardiovascular: negative Gastrointestinal: negative Genitourinary:positive for abnormal menstrual cycles Integument/breast: negative Hematologic/lymphatic: negative Musculoskeletal:negative Neurological: negative Behavioral/Psych: negative Endocrine: negative Allergic/Immunologic: negative    Physical exam BP 101/73   Pulse 82   Temp 97.8 F (36.6 C) (Oral)   Ht _0  (1.575 m)   Wt 145 lb (65.8 kg)   BMI 26.52 kg/m  General appearance: alert, cooperative, appears stated age and no distress Head: Normocephalic, without obvious abnormality, atraumatic Eyes: conjunctivae/corneas clear. PERRL, EOM's intact. Fundi benign. Ears: normal TM's and external ear canals both ears Nose: Nares normal. Septum midline. Mucosa normal. No drainage or sinus tenderness. Throat: lips, mucosa, and tongue normal; teeth and gums normal Neck: no adenopathy, supple, symmetrical, trachea midline and  thyroid not enlarged, symmetric, no tenderness/mass/nodules Back: symmetric, no curvature. ROM normal. No CVA tenderness. Lungs: clear to auscultation bilaterally Breasts: not examined Heart: regular rate and rhythm, S1, S2 normal, no murmur, click, rub or gallop Abdomen: soft, non-tender; bowel sounds normal; no masses,  no organomegaly Pelvic: cervix normal in appearance, external genitalia normal, no adnexal masses or tenderness, no cervical motion tenderness, rectovaginal septum normal, uterus normal size, shape, and consistency and vagina normal without discharge Extremities: extremities normal, atraumatic, no cyanosis or edema Pulses: 2+ and symmetric Skin: Skin color, texture, turgor normal. No rashes or lesions Lymph nodes: Cervical, supraclavicular, and axillary nodes normal. Neurologic: Alert and oriented X 3, normal strength and tone. Normal symmetric reflexes. Normal coordination and gait  Psych: Mood stable, speech normal, affect appropriate, pleasant, interactive Depression screen [facility] 2/9 01/21/2018 03/18/2017 03/20/2016  Decreased Interest 0 - 0  Down, Depressed, Hopeless 0 0 0  PHQ - 2 Score 0 0 0    Assessment/ Plan: Elizabeth [name] here for annual physical exam.   1. Well woman exam with routine gynecological exam Given early family history of breast cancer, I did recommend mammography.  She will schedule mammogram at checkout today.  Pap obtained today with cotesting and STI screening. - Pap IG, CT/NG NAA, and HPV (high risk) [facility]  2. Vitamin D deficiency Vitamin D deficiency noted on previous checks.  Will check today. - VITAMIN D 25 Hydroxy (Vit-D Deficiency, Fractures)  3. Tobacco use disorder Contemplative.  She actually did try to stop smoking and was fairly successful for about 6 weeks but noted significant mood changes and therefore started smoking again.  Currently smoking about a pack per day.  She does understand the risks of continued tobacco use,  including increased risk of cancer and cardiovascular disease.  We discussed various options, including Wellbutrin, nortriptyline and Chantix today.  She will contemplate this and get back to me should she decide to go with pharmacologic therapy.  4. Tetanus, diphtheria, and acellular pertussis (Tdap) vaccination declined  5. Abnormal menstrual periods Continues to have monthly periods but periods last 3 days stop and then resume again for 3 more days.  Will check metabolic labs to evaluate further. - CMP14+EGFR - CBC with Differential - TSH  6. BMI 26.0-26.9,adult  7. Screening for lipid disorders - Lipid Panel  8. Screening for malignant neoplasm of cervix - Pap IG, CT/NG NAA, and HPV (high risk) [facility]   Counseled on healthy lifestyle choices, including diet (rich in fruits, vegetables and lean meats and low in salt and simple carbohydrates) and exercise (at least 30 minutes of moderate physical activity daily).  Patient to follow up in 1 year for annual exam or sooner if needed.  Ashly M. Lajuana Ripple, DO

## 2018-01-22 LAB — LIPID PANEL
CHOL/HDL RATIO: 4 ratio (ref 0.0–4.4)
Cholesterol, Total: 220 mg/dL — ABNORMAL HIGH (ref 100–199)
HDL: 55 mg/dL (ref >39)
LDL Calculated: 148 mg/dL — ABNORMAL HIGH (ref 0–99)
TRIGLYCERIDES: 87 mg/dL (ref 0–149)
VLDL Cholesterol Cal: 17 mg/dL (ref 5–40)

## 2018-01-22 LAB — CMP14+EGFR
ALT: 34 IU/L — ABNORMAL HIGH (ref 0–32)
AST: 33 IU/L (ref 0–40)
Albumin/Globulin Ratio: 1.8 (ref 1.2–2.2)
Albumin: 4.4 g/dL (ref 3.5–5.5)
Alkaline Phosphatase: 70 IU/L (ref 39–117)
BUN/Creatinine Ratio: 11 (ref 9–23)
BUN: 7 mg/dL (ref 6–24)
Bilirubin Total: 0.4 mg/dL (ref 0.0–1.2)
CALCIUM: 9.4 mg/dL (ref 8.7–10.2)
CO2: 22 mmol/L (ref 20–29)
CREATININE: 0.64 mg/dL (ref 0.57–1.00)
Chloride: 104 mmol/L (ref 96–106)
GFR calc Af Amer: 129 mL/min/{1.73_m2} (ref >59)
GFR, EST NON AFRICAN AMERICAN: 112 mL/min/{1.73_m2} (ref >59)
GLOBULIN, TOTAL: 2.4 g/dL (ref 1.5–4.5)
Glucose: 89 mg/dL (ref 65–99)
Potassium: 4.5 mmol/L (ref 3.5–5.2)
Sodium: 140 mmol/L (ref 134–144)
TOTAL PROTEIN: 6.8 g/dL (ref 6.0–8.5)

## 2018-01-22 LAB — CBC WITH DIFFERENTIAL/PLATELET
BASOS: 0 %
Basophils Absolute: 0 10*3/uL (ref 0.0–0.2)
EOS (ABSOLUTE): 0.1 10*3/uL (ref 0.0–0.4)
Eos: 1 %
Hematocrit: 42.4 % (ref 34.0–46.6)
Hemoglobin: 14.6 g/dL (ref 11.1–15.9)
Immature Grans (Abs): 0 10*3/uL (ref 0.0–0.1)
Immature Granulocytes: 0 %
LYMPHS ABS: 2.8 10*3/uL (ref 0.7–3.1)
Lymphs: 37 %
MCH: 31.5 pg (ref 26.6–33.0)
MCHC: 34.4 g/dL (ref 31.5–35.7)
MCV: 92 fL (ref 79–97)
Monocytes Absolute: 0.7 10*3/uL (ref 0.1–0.9)
Monocytes: 8 %
NEUTROS ABS: 4.1 10*3/uL (ref 1.4–7.0)
Neutrophils: 54 %
PLATELETS: 350 10*3/uL (ref 150–450)
RBC: 4.63 x10E6/uL (ref 3.77–5.28)
RDW: 13.3 % (ref 12.3–15.4)
WBC: 7.7 10*3/uL (ref 3.4–10.8)

## 2018-01-22 LAB — VITAMIN D 25 HYDROXY (VIT D DEFICIENCY, FRACTURES): Vit D, 25-Hydroxy: 26 ng/mL — ABNORMAL LOW (ref 30.0–100.0)

## 2018-01-22 LAB — TSH: TSH: 1.99 u[IU]/mL (ref 0.450–4.500)

## 2018-01-24 LAB — PAP IG, CT-NG NAA, HPV HIGH-RISK
Chlamydia, Nuc. Acid Amp: NEGATIVE
Gonococcus by Nucleic Acid Amp: NEGATIVE
HPV, high-risk: NEGATIVE
PAP SMEAR COMMENT: 0

## 2018-09-14 ENCOUNTER — Ambulatory Visit: Payer: PRIVATE HEALTH INSURANCE | Admitting: Family Medicine

## 2019-11-04 ENCOUNTER — Other Ambulatory Visit: Payer: Self-pay

## 2019-11-04 ENCOUNTER — Ambulatory Visit (INDEPENDENT_AMBULATORY_CARE_PROVIDER_SITE_OTHER): Payer: PRIVATE HEALTH INSURANCE

## 2019-11-04 ENCOUNTER — Other Ambulatory Visit: Payer: Self-pay | Admitting: Nurse Practitioner

## 2019-11-04 ENCOUNTER — Encounter: Payer: Self-pay | Admitting: Nurse Practitioner

## 2019-11-04 ENCOUNTER — Ambulatory Visit: Payer: PRIVATE HEALTH INSURANCE | Admitting: Nurse Practitioner

## 2019-11-04 VITALS — BP 104/73 | HR 70 | Temp 97.9°F | Ht 62.0 in | Wt 156.2 lb

## 2019-11-04 DIAGNOSIS — N920 Excessive and frequent menstruation with regular cycle: Secondary | ICD-10-CM | POA: Diagnosis not present

## 2019-11-04 DIAGNOSIS — M25572 Pain in left ankle and joints of left foot: Secondary | ICD-10-CM

## 2019-11-04 DIAGNOSIS — M79672 Pain in left foot: Secondary | ICD-10-CM | POA: Diagnosis not present

## 2019-11-04 DIAGNOSIS — M67479 Ganglion, unspecified ankle and foot: Secondary | ICD-10-CM | POA: Diagnosis not present

## 2019-11-04 MED ORDER — MEDROXYPROGESTERONE ACETATE 10 MG PO TABS: 10.0000 mg | ORAL_TABLET | Freq: Every day | ORAL | 0 refills | Status: DC

## 2019-11-04 MED ORDER — NAPROXEN 500 MG PO TABS: 500.0000 mg | ORAL_TABLET | Freq: Two times a day (BID) | ORAL | 1 refills | Status: DC

## 2019-11-04 NOTE — Progress Notes (Signed)
Acute Office Visit  Subjective:    Patient ID: Elizabeth [name], female    DOB: 10-06-1975, 44 y.o.   MRN: [id number]  Chief Complaint  Patient presents with  . Cyst    pt here today c/o "knot" next to left ankle since Feb that is growing and is now painful    HPI Patient is in today with 2 complaints: -  a cyst on her left lateral dorsal foot. She states that there is pain with palpation of the cyst and she has limited movement of her foot because of the cyst. - Patient is also complaining of a heavier than normal menstrual cycle. States she soaks about 10 super tampons in a 24 hour period.     Past Medical History:  Diagnosis Date  . Anxiety   . Depression     Past Surgical History:  Procedure Laterality Date  . TUBAL LIGATION      Family History  Problem Relation Age of Onset  . Hyperlipidemia Mother   . Heart disease Mother   . Cancer Mother 64       ovarian cancer  . Ovarian cancer Mother   . Diabetes Father   . Drug abuse Brother        death 2/2 OD  . Cancer Maternal Grandmother        lung cancer  . Alzheimer's disease Maternal Grandfather   . Cervical cancer Maternal Aunt   . Breast cancer Maternal Aunt 20    Social History   Socioeconomic History  . Marital status: Married    Spouse name: Not on file  . Number of children: Not on file  . Years of education: Not on file  . Highest education level: Not on file  Occupational History  . Not on file  Tobacco Use  . Smoking status: Former Smoker    Packs/day: 0.50    Years: 28.00    Pack years: 14.00    Types: Cigarettes    Quit date: 09/06/2019    Years since quitting: 0.1  . Smokeless tobacco: Never Used  Substance and Sexual Activity  . Alcohol use: Yes    Comment: occasionally  . Drug use: Yes    Frequency: 1.0 times per week    Types: Marijuana  . Sexual activity: Yes    Birth control/protection: Surgical  Other Topics Concern  . Not on file  Social History Narrative  . Not on file     Social Determinants of Health   Financial Resource Strain:   . Difficulty of Paying Living Expenses:   Food Insecurity:   . Worried About Programme researcher, broadcasting/film/video in the Last Year:   . Barista in the Last Year:   Transportation Needs:   . Freight forwarder (Medical):   Marland Kitchen Lack of Transportation (Non-Medical):   Physical Activity:   . Days of Exercise per Week:   . Minutes of Exercise per Session:   Stress:   . Feeling of Stress :   Social Connections:   . Frequency of Communication with Friends and Family:   . Frequency of Social Gatherings with Friends and Family:   . Attends Religious Services:   . Active Member of Clubs or Organizations:   . Attends Banker Meetings:   Marland Kitchen Marital Status:   Intimate Partner Violence:   . Fear of Current or Ex-Partner:   . Emotionally Abused:   Marland Kitchen Physically Abused:   . Sexually Abused:  Outpatient Medications Prior to Visit  Medication Sig Dispense Refill  . nicotine (NICODERM CQ - DOSED IN MG/24 HR) 7 mg/24hr patch Place 7 mg onto the skin daily.     No facility-administered medications prior to visit.    No Known Allergies  Review of Systems  Genitourinary: Positive for menstrual problem.  All other systems reviewed and are negative.      Objective:    Physical Exam Constitutional:      Appearance: Normal appearance.  HENT:     Head: Normocephalic.  Eyes:     Conjunctiva/sclera: Conjunctivae normal.  Cardiovascular:     Rate and Rhythm: Regular rhythm.     Heart sounds: Normal heart sounds.  Pulmonary:     Breath sounds: Normal breath sounds.  Musculoskeletal:        General: Tenderness present.     Comments: 3cm raised fluid filled tender lesion left foot near lateral side of talus bone.  Skin:      Neurological:     Mental Status: She is oriented to person, place, and time.     BP 104/73   Pulse 70   Temp 97.9 F (36.6 C) (Temporal)   Ht 5\' 2"  (1.575 m)   Wt 156 lb 4 oz (70.9 kg)    BMI 28.58 kg/m   Left foot and ankle xray- normal-Preliminary reading by Ronnald Collum, FNP  [facility]      Assessment & Plan:   [name] in today with chief complaint of Cyst (pt here today c/o "knot" next to left ankle since Feb that is growing and is now painful)   1. Left foot pain/ganglion cyst - DG Foot Complete Left; X-ray of foot normal. Will refer to orthopedist. - Naproxen 500 mg twice a day PRN until she can see orthopedist.  2. Menorrhagia with regular cycle Discussed provera use and mechanism of action- patient undeerstands that bleeding may get heavier for a few days Needs to schedule PAP once bleeding subsides - medroxyPROGESTERone (PROVERA) 10 MG tablet; Take 1 tablet (10 mg total) by mouth daily.  Dispense: 10 tablet; Refill: 0    The above assessment and management plan was discussed with the patient. The patient verbalized understanding of and has agreed to the management plan. Patient is aware to call the clinic if symptoms persist or worsen. Patient is aware when to return to the clinic for a follow-up visit. Patient educated on when it is appropriate to go to the emergency department.   Mary-Margaret Hassell Done, FNP Robynn Pane, RN, FNP student

## 2019-11-04 NOTE — Patient Instructions (Signed)
Ganglion Cyst  A ganglion cyst is a non-cancerous, fluid-filled lump that occurs near a joint or tendon. The cyst grows out of a joint or the lining of a tendon. Ganglion cysts most often develop in the hand or wrist, but they can also develop in the shoulder, elbow, hip, knee, ankle, or foot. Ganglion cysts are ball-shaped or egg-shaped. Their size can range from the size of a pea to larger than a grape. Increased activity may cause the cyst to get bigger because more fluid starts to build up. What are the causes? The exact cause of this condition is not known, but it may be related to:  Inflammation or irritation around the joint.  An injury.  Repetitive movements or overuse.  Arthritis. What increases the risk? You are more likely to develop this condition if:  You are a woman.  You are 15-40 years old. What are the signs or symptoms? The main symptom of this condition is a lump. It most often appears on the hand or wrist. In many cases, there are no other symptoms, but a cyst can sometimes cause:  Tingling.  Pain.  Numbness.  Muscle weakness.  Weak grip.  Less range of motion in a joint. How is this diagnosed? Ganglion cysts are usually diagnosed based on a physical exam. Your health care provider will feel the lump and may shine a light next to it. If it is a ganglion cyst, the light will likely shine through it. Your health care provider may order an X-ray, ultrasound, or MRI to rule out other conditions. How is this treated? Ganglion cysts often go away on their own without treatment. If you have pain or other symptoms, treatment may be needed. Treatment is also needed if the ganglion cyst limits your movement or if it gets infected. Treatment may include:  Wearing a brace or splint on your wrist or finger.  Taking anti-inflammatory medicine.  Having fluid drained from the lump with a needle (aspiration).  Getting a steroid injected into the joint.  Having  surgery to remove the ganglion cyst.  Placing a pad on your shoe or wearing shoes that will not rub against the cyst if it is on your foot. Follow these instructions at home:  Do not press on the ganglion cyst, poke it with a needle, or hit it.  Take over-the-counter and prescription medicines only as told by your health care provider.  If you have a brace or splint: ? Wear it as told by your health care provider. ? Remove it as told by your health care provider. Ask if you need to remove it when you take a shower or a bath.  Watch your ganglion cyst for any changes.  Keep all follow-up visits as told by your health care provider. This is important. Contact a health care provider if:  Your ganglion cyst becomes larger or more painful.  You have pus coming from the lump.  You have weakness or numbness in the affected area.  You have a fever or chills. Get help right away if:  You have a fever and have any of these in the cyst area: ? Increased redness. ? Red streaks. ? Swelling. Summary  A ganglion cyst is a non-cancerous, fluid-filled lump that occurs near a joint or tendon.  Ganglion cysts most often develop in the hand or wrist, but they can also develop in the shoulder, elbow, hip, knee, ankle, or foot.  Ganglion cysts often go away on their own without treatment.   This information is not intended to replace advice given to you by your health care provider. Make sure you discuss any questions you have with your health care provider. Document Revised: 06/06/2017 Document Reviewed: 02/21/2017 Elsevier Patient Education  2020 Elsevier Inc.  

## 2019-11-16 ENCOUNTER — Telehealth: Payer: Self-pay | Admitting: Family Medicine

## 2020-08-23 ENCOUNTER — Ambulatory Visit (INDEPENDENT_AMBULATORY_CARE_PROVIDER_SITE_OTHER): Payer: 59 | Admitting: Nurse Practitioner

## 2020-08-23 ENCOUNTER — Encounter: Payer: Self-pay | Admitting: Nurse Practitioner

## 2020-08-23 ENCOUNTER — Other Ambulatory Visit: Payer: Self-pay

## 2020-08-23 ENCOUNTER — Ambulatory Visit: Payer: PRIVATE HEALTH INSURANCE | Admitting: Nurse Practitioner

## 2020-08-23 VITALS — BP 132/76 | HR 90 | Temp 98.0°F | Ht 62.0 in | Wt 184.8 lb

## 2020-08-23 DIAGNOSIS — N6012 Diffuse cystic mastopathy of left breast: Secondary | ICD-10-CM | POA: Diagnosis not present

## 2020-08-23 DIAGNOSIS — F17200 Nicotine dependence, unspecified, uncomplicated: Secondary | ICD-10-CM

## 2020-08-23 NOTE — Progress Notes (Signed)
Acute Office Visit  Subjective:    Patient ID: Elizabeth [name], female    DOB: February 13, 1976, 45 y.o.   MRN: [id number]  Chief Complaint  Patient presents with  . Breast Problem    HPI Patient is a 45 year old female with past medical history of anxiety and depression. She  presents to clinic with discoloration of the left breast. Patient reports the symptoms present in the last 1 week. She denies pain, fever or itching. Patient is a smoker since she was 45 years old. A family history of breast cancer with 2 aunts and cervical cancer with one aunt and mother ovarian cancer. Patient has never had a mammogram.  Past Medical History:  Diagnosis Date  . Anxiety   . Depression     Past Surgical History:  Procedure Laterality Date  . TUBAL LIGATION      Family History  Problem Relation Age of Onset  . Hyperlipidemia Mother   . Heart disease Mother   . Cancer Mother 39       ovarian cancer  . Ovarian cancer Mother   . Diabetes Father   . Drug abuse Brother        death 2/2 OD  . Cancer Maternal Grandmother        lung cancer  . Alzheimer's disease Maternal Grandfather   . Cervical cancer Maternal Aunt   . Breast cancer Maternal Aunt 1    Social History   Socioeconomic History  . Marital status: Married    Spouse name: Not on file  . Number of children: Not on file  . Years of education: Not on file  . Highest education level: Not on file  Occupational History  . Not on file  Tobacco Use  . Smoking status: Current Every Day Smoker    Packs/day: 0.50    Years: 28.00    Pack years: 14.00    Types: Cigarettes    Last attempt to quit: 09/06/2019    Years since quitting: 0.9  . Smokeless tobacco: Never Used  Vaping Use  . Vaping Use: Never used  Substance and Sexual Activity  . Alcohol use: Yes    Comment: occasionally  . Drug use: Yes    Frequency: 1.0 times per week    Types: Marijuana  . Sexual activity: Yes    Birth control/protection: Surgical  Other Topics  Concern  . Not on file  Social History Narrative  . Not on file   Social Determinants of Health   Financial Resource Strain: Not on file  Food Insecurity: Not on file  Transportation Needs: Not on file  Physical Activity: Not on file  Stress: Not on file  Social Connections: Not on file  Intimate Partner Violence: Not on file    Outpatient Medications Prior to Visit  Medication Sig Dispense Refill  . medroxyPROGESTERone (PROVERA) 10 MG tablet Take 1 tablet (10 mg total) by mouth daily. 10 tablet 0  . naproxen (NAPROSYN) 500 MG tablet Take 1 tablet (500 mg total) by mouth 2 (two) times daily with a meal. 60 tablet 1  . nicotine (NICODERM CQ - DOSED IN MG/24 HR) 7 mg/24hr patch Place 7 mg onto the skin daily.     No facility-administered medications prior to visit.    No Known Allergies  Review of Systems  HENT: Negative.   Respiratory: Negative.   Cardiovascular: Negative.   Genitourinary: Negative.   Skin: Positive for color change and wound. Negative for rash.  Neurological: Negative.  All other systems reviewed and are negative.      Objective:    Physical Exam Vitals reviewed.  Constitutional:      Appearance: Normal appearance.  HENT:     Head: Normocephalic.     Nose: Nose normal.  Eyes:     Conjunctiva/sclera: Conjunctivae normal.  Cardiovascular:     Rate and Rhythm: Normal rate and regular rhythm.     Pulses: Normal pulses.     Heart sounds: Normal heart sounds.  Pulmonary:     Effort: Pulmonary effort is normal.     Breath sounds: Normal breath sounds.  Chest:       Comments: Discoloration on left breast, with fibrocystic texture at 12:00 oclock above the nipple area. Abdominal:     General: Bowel sounds are normal.  Skin:    General: Skin is dry.     Findings: Erythema present.  Neurological:     Mental Status: She is alert and oriented to person, place, and time.  Psychiatric:        Behavior: Behavior normal.     BP 132/76   Pulse  90   Temp 98 F (36.7 C)   Ht 5\' 2"  (1.575 m)   Wt 184 lb 12.8 oz (83.8 kg)   SpO2 99%   BMI 33.80 kg/m  Wt Readings from Last 3 Encounters:  08/23/20 184 lb 12.8 oz (83.8 kg)  11/04/19 156 lb 4 oz (70.9 kg)  01/21/18 145 lb (65.8 kg)    Health Maintenance Due  Topic Date Due  . Hepatitis C Screening  Never done  . COVID-19 Vaccine (1) Never done  . TETANUS/TDAP  11/10/2016  . INFLUENZA VACCINE  02/06/2020    There are no preventive care reminders to display for this patient.   Lab Results  Component Value Date   TSH 1.990 01/21/2018   Lab Results  Component Value Date   WBC 7.7 01/21/2018   HGB 14.6 01/21/2018   HCT 42.4 01/21/2018   MCV 92 01/21/2018   PLT 350 01/21/2018   Lab Results  Component Value Date   NA 140 01/21/2018   K 4.5 01/21/2018   CO2 22 01/21/2018   GLUCOSE 89 01/21/2018   BUN 7 01/21/2018   CREATININE 0.64 01/21/2018   BILITOT 0.4 01/21/2018   ALKPHOS 70 01/21/2018   AST 33 01/21/2018   ALT 34 (H) 01/21/2018   PROT 6.8 01/21/2018   ALBUMIN 4.4 01/21/2018   CALCIUM 9.4 01/21/2018   Lab Results  Component Value Date   CHOL 220 (H) 01/21/2018   Lab Results  Component Value Date   HDL 55 01/21/2018   Lab Results  Component Value Date   LDLCALC 148 (H) 01/21/2018   Lab Results  Component Value Date   TRIG 87 01/21/2018   Lab Results  Component Value Date   CHOLHDL 4.0 01/21/2018   No results found for: HGBA1C     Assessment & Plan:   Problem List Items Addressed This Visit      Other   Tobacco use disorder    I continue to encourage tobacco cessation. Patient is planning on working on quitting smoking in the future.      Fibrocystic disease of left breast - Primary    New symptoms of fibrocystic texture and discoloration on the left breast at 12:00 o'clock. Patient reports symptoms present in the last 7 days. Patient is reporting no pain, no fever or itching. Patient is a smoker, patient has never had  a mammogram  done. Family history of breast cancer with 2 aunts, one aunt cervical cancer, and mother ovarian cancer.  Stat referral to diagnostic mammogram completed. Results pending.  Advised patient to follow-up with worsening or unresolved symptoms.      Relevant Orders   MM Digital Diagnostic Bilat       No orders of the defined types were placed in this encounter.    Daryll Drown, NP

## 2020-08-23 NOTE — Patient Instructions (Signed)
Referral STAT diagnostic mammogram left breast completed.  fibrocystic Breast Changes  Fibrocystic breast changes are changes that can make your breasts swollen or painful. These changes happen when there is scar-like tissue (fibrous tissue) in the breasts or tiny sacs of fluid (cysts) form in the breast. This is a common condition. It does not mean that you have cancer. It usually happens because of hormone changes during a monthly period. What are the causes? The exact cause of this condition is not known. However, you are more likely to have it:  If you have high levels of female hormones.  If your mother had the same condition (inherited). What are the signs or symptoms? Symptoms of this condition include:  Tenderness, swelling, mild discomfort, or pain.  Rope-like tissue that can be felt when touching the breast.  Lumps in one or both breasts.  Changes in breast size. Breasts may get larger before your period and smaller after your period.  Discharge from the nipple. How is this treated? In many cases, there is no treatment for this condition. In some cases, you may need treatment, including:  Taking medicines.  Avoiding caffeine.  Reducing the amount of sugar and fat in your diet. You may also have:  A procedure to remove fluid from a cyst.  Surgery to remove a cyst that is large or tender or does not go away.  Medicines that may lower female hormones in the body. Follow these instructions at home: Self care Check your breasts after every monthly period. If you do not have monthly periods, check your breasts on the first day of every month. Check for:  Soreness.  New swelling or puffiness.  A change in breast size.  A change in a lump that was already there. General instructions  Take over-the-counter and prescription medicines only as told by your doctor.  Wear a support or sports bra that fits well. Wear this support especially when you are  exercising.  If told by your doctor, avoid or have less caffeine, fat, and sugar in what you eat and drink.  Keep all follow-up visits as told by your doctor. This is important. Contact a doctor if:  You have fluid coming from your nipple, especially if the fluid has blood in it.  You have new lumps or bumps in your breast.  Your breast gets puffy, red, and painful.  You have changes in how your breast looks.  Your nipple looks flat or it sinks into your breast. Get help right away if:  Your breast turns red, and the redness is spreading. Summary  Fibrocystic breast changes are changes that can make your breasts swollen or painful.  This condition can happen when you have hormone changes during your monthly period.  Check your breasts after every monthly period. If you do not have monthly periods, check your breasts on the first day of every month. This information is not intended to replace advice given to you by your health care provider. Make sure you discuss any questions you have with your health care provider. Document Revised: 06/07/2019 Document Reviewed: 06/07/2019 Elsevier Patient Education  2021 [employer].

## 2020-08-23 NOTE — Assessment & Plan Note (Signed)
I continue to encourage tobacco cessation. Patient is planning on working on quitting smoking in the future.

## 2020-08-23 NOTE — Assessment & Plan Note (Signed)
New symptoms of fibrocystic texture and discoloration on the left breast at 12:00 o'clock. Patient reports symptoms present in the last 7 days. Patient is reporting no pain, no fever or itching. Patient is a smoker, patient has never had a mammogram done. Family history of breast cancer with 2 aunts, one aunt cervical cancer, and mother ovarian cancer.  Stat referral to diagnostic mammogram completed. Results pending.  Advised patient to follow-up with worsening or unresolved symptoms.

## 2020-08-25 ENCOUNTER — Other Ambulatory Visit: Payer: Self-pay

## 2020-08-25 DIAGNOSIS — N6012 Diffuse cystic mastopathy of left breast: Secondary | ICD-10-CM

## 2020-09-26 ENCOUNTER — Other Ambulatory Visit (HOSPITAL_COMMUNITY): Payer: Self-pay

## 2020-09-26 ENCOUNTER — Encounter (HOSPITAL_COMMUNITY): Payer: Self-pay

## 2020-10-03 ENCOUNTER — Ambulatory Visit (INDEPENDENT_AMBULATORY_CARE_PROVIDER_SITE_OTHER): Payer: 59 | Admitting: Family Medicine

## 2020-10-03 ENCOUNTER — Other Ambulatory Visit (HOSPITAL_COMMUNITY)
Admission: RE | Admit: 2020-10-03 | Discharge: 2020-10-03 | Disposition: A | Discharge status: Home / Self Care | Payer: Managed Care, Other (non HMO) | Source: Ambulatory Visit | Attending: Family Medicine | Admitting: Family Medicine

## 2020-10-03 ENCOUNTER — Encounter: Payer: Self-pay | Admitting: Family Medicine

## 2020-10-03 ENCOUNTER — Other Ambulatory Visit: Payer: Self-pay

## 2020-10-03 VITALS — BP 118/86 | HR 101 | Temp 98.5°F | Ht 62.0 in | Wt 183.2 lb

## 2020-10-03 DIAGNOSIS — Z124 Encounter for screening for malignant neoplasm of cervix: Secondary | ICD-10-CM

## 2020-10-03 DIAGNOSIS — Z1159 Encounter for screening for other viral diseases: Secondary | ICD-10-CM

## 2020-10-03 DIAGNOSIS — N760 Acute vaginitis: Secondary | ICD-10-CM

## 2020-10-03 DIAGNOSIS — Z01411 Encounter for gynecological examination (general) (routine) with abnormal findings: Secondary | ICD-10-CM

## 2020-10-03 DIAGNOSIS — Z01419 Encounter for gynecological examination (general) (routine) without abnormal findings: Secondary | ICD-10-CM | POA: Diagnosis present

## 2020-10-03 DIAGNOSIS — K219 Gastro-esophageal reflux disease without esophagitis: Secondary | ICD-10-CM

## 2020-10-03 DIAGNOSIS — N898 Other specified noninflammatory disorders of vagina: Secondary | ICD-10-CM

## 2020-10-03 DIAGNOSIS — F17200 Nicotine dependence, unspecified, uncomplicated: Secondary | ICD-10-CM

## 2020-10-03 DIAGNOSIS — B9689 Other specified bacterial agents as the cause of diseases classified elsewhere: Secondary | ICD-10-CM | POA: Diagnosis not present

## 2020-10-03 DIAGNOSIS — E559 Vitamin D deficiency, unspecified: Secondary | ICD-10-CM

## 2020-10-03 DIAGNOSIS — E782 Mixed hyperlipidemia: Secondary | ICD-10-CM

## 2020-10-03 DIAGNOSIS — R748 Abnormal levels of other serum enzymes: Secondary | ICD-10-CM

## 2020-10-03 LAB — WET PREP FOR TRICH, YEAST, CLUE
Clue Cell Exam: POSITIVE — AB
Trichomonas Exam: NEGATIVE
Yeast Exam: NEGATIVE

## 2020-10-03 MED ORDER — METRONIDAZOLE 500 MG PO TABS: 500.0000 mg | ORAL_TABLET | Freq: Two times a day (BID) | ORAL | 0 refills | Status: AC

## 2020-10-03 MED ORDER — METRONIDAZOLE 0.75 % VA GEL: VAGINAL | 1 refills | Status: DC

## 2020-10-03 MED ORDER — BUPROPION HCL 75 MG PO TABS: 75.0000 mg | ORAL_TABLET | Freq: Every day | ORAL | 1 refills | Status: DC

## 2020-10-03 NOTE — Progress Notes (Signed)
Elizabeth [name] is a 45 y.o. female presents to office today for annual physical exam examination.    Concerns today include: 1.  Vaginal discharge Patient reports that she has a discharge with associated fishy odor for several days following her menstrual cycles.  Her menses are regular.  She has normal flow.  She does not otherwise have any vaginal irritation, postcoital bleeding or discomfort.  2.  Hoarseness/obesity/tobacco use disorder Patient reports that she has been having some intermittent hoarseness without being sick since she gained weight.  She does admit to GERD.  She is been using some over-the-counter Nexium and this seems to work well when she remembers to take it.  She otherwise takes Tums for immediate relief.  She notes that the weight gain is related to her stopping smoking.  She was able to successfully quit for 6 months with the use of patches but unfortunately gained 50 pounds of weight as a result.  She has since gone back to smoking is currently smoking half pack per day.  No difficulty swallowing, no hemoptysis, no night sweats.  She does have some dyspnea on exertion but relates this to physical deconditioning.  No wheezing  Occupation: Working.  She is married.  She recently got back from [place].  Substance use: Tobacco use as above Diet: Currently trying to restrict and do intermittent fasting, Exercise: Physical activity is limited secondary to deconditioning Last eye exam: Due Last dental exam: Due Last mammogram: Up-to-date.  Plan for repeat in 6 months given fibrocystic changes left breast Last pap smear: Due in July  Past Medical History:  Diagnosis Date  . Anxiety   . Depression    Social History   Socioeconomic History  . Marital status: Married    Spouse name: Not on file  . Number of children: Not on file  . Years of education: Not on file  . Highest education level: Not on file  Occupational History  . Not on file  Tobacco Use  . Smoking  status: Current Every Day Smoker    Packs/day: 0.50    Years: 28.00    Pack years: 14.00    Types: Cigarettes    Last attempt to quit: 09/06/2019    Years since quitting: 1.0  . Smokeless tobacco: Never Used  Vaping Use  . Vaping Use: Never used  Substance and Sexual Activity  . Alcohol use: Yes    Comment: occasionally  . Drug use: Yes    Frequency: 1.0 times per week    Types: Marijuana  . Sexual activity: Yes    Birth control/protection: Surgical  Other Topics Concern  . Not on file  Social History Narrative  . Not on file   Social Determinants of Health   Financial Resource Strain: Not on file  Food Insecurity: Not on file  Transportation Needs: Not on file  Physical Activity: Not on file  Stress: Not on file  Social Connections: Not on file  Intimate Partner Violence: Not on file   Past Surgical History:  Procedure Laterality Date  . TUBAL LIGATION     Family History  Problem Relation Age of Onset  . Hyperlipidemia Mother   . Heart disease Mother   . Cancer Mother 34       ovarian cancer  . Ovarian cancer Mother   . Diabetes Father   . Drug abuse Brother        death 2/2 OD  . Cancer Maternal Grandmother  lung cancer  . Alzheimer's disease Maternal Grandfather   . Cervical cancer Maternal Aunt   . Breast cancer Maternal Aunt 45   No current outpatient medications on file.  No Known Allergies   ROS: Review of Systems Pertinent items noted in HPI and remainder of comprehensive ROS otherwise negative.    Physical exam BP 118/86   Pulse (!) 101   Temp 98.5 F (36.9 C) (Temporal)   Ht _0  (1.575 m)   Wt 183 lb 3.2 oz (83.1 kg)   SpO2 98%   BMI 33.51 kg/m  General appearance: alert, cooperative, appears stated age, no distress and mildly obese Head: Normocephalic, without obvious abnormality, atraumatic Eyes: negative findings: lids and lashes normal, conjunctivae and sclerae normal, corneas clear and pupils equal, round, reactive to light  and accomodation Ears: normal TM's and external ear canals both ears Nose: Nares normal. Septum midline. Mucosa normal. No drainage or sinus tenderness. Throat: Artificial bridge on top.  Moist mucous membranes.  No oropharyngeal or sublingual masses Neck: no adenopathy, no carotid bruit, no JVD, supple, symmetrical, trachea midline and thyroid not enlarged, symmetric, no tenderness/mass/nodules Back: symmetric, no curvature. ROM normal. No CVA tenderness. Lungs: clear to auscultation bilaterally Breasts: Pendulous Heart: regular rate and rhythm, S1, S2 normal, no murmur, click, rub or gallop Abdomen: soft, non-tender; bowel sounds normal; no masses,  no organomegaly Pelvic: cervix normal in appearance, external genitalia normal, no adnexal masses or tenderness, no cervical motion tenderness, rectovaginal septum normal, uterus normal size, shape, and consistency and yellow discharge noted within the vaginal vault Extremities: extremities normal, atraumatic, no cyanosis or edema Pulses: 2+ and symmetric Skin: Skin color, texture, turgor normal. No rashes or lesions Lymph nodes: Cervical, supraclavicular, and axillary nodes normal. Neurologic: Alert and oriented X 3, normal strength and tone. Normal symmetric reflexes. Normal coordination and gait Psych: mood stable.    Assessment/ Plan: Elizabeth [name] here for annual physical exam.   Well woman exam with routine gynecological exam - Plan: Cytology - PAP  Screening for malignant neoplasm of cervix - Plan: Cytology - PAP  Tobacco use disorder - Plan: CBC, buPROPion (WELLBUTRIN) 75 MG tablet  Vitamin D deficiency - Plan: VITAMIN D 25 Hydroxy (Vit-D Deficiency, Fractures)  Elevated liver enzymes - Plan: CMP14+EGFR  Mixed hyperlipidemia - Plan: LDL Cholesterol, Direct  Encounter for hepatitis C screening test for low risk patient - Plan: Hepatitis C antibody  Vaginal discharge - Plan: WET PREP FOR TRICH, YEAST, CLUE  Bacterial  vaginosis - Plan: metroNIDAZOLE (METROGEL VAGINAL) 0.75 % vaginal gel, metroNIDAZOLE (FLAGYL) 500 MG tablet  Gastroesophageal reflux disease without esophagitis  Pap obtained today.    Start Wellbutrin at low dose of 75 mg daily.  Hopefully this will assist with smoking cessation as well as weight stabilization.  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  I think that patient is therefore average risk with this medication  Check liver enzymes, direct LDL since she is nonfasting  Hepatitis C screen ordered  Vaginal discharge consistent with bacterial vaginosis.  She was given a 7-day oral course and then also given vaginal gel to use as needed for 2 days following menses as a prophylaxis.  I suspect the hoarseness that she has been experiencing is secondary to uncontrolled GERD.  Advised to use Nexium consistently for 2 weeks before discontinuing.  May continue to use as needed   Counseled on healthy lifestyle choices, including diet (rich in fruits,  vegetables and lean meats and low in salt and simple carbohydrates) and exercise (at least 30 minutes of moderate physical activity daily).  She will follow-up in the next couple of months for recheck and to evaluate tolerance of Wellbutrin.  We will advance dose pending response.  Carolyn Sylvia M. Lajuana Ripple, DO

## 2020-10-03 NOTE — Patient Instructions (Addendum)
You had labs performed today.  You will be contacted with the results of the labs once they are available, usually in the next 3 business days for routine lab work.  If you have an active my chart account, they will be released to your MyChart.  If you prefer to have these labs released to you via telephone, please let us know.  If you had a pap smear or biopsy performed, expect to be contacted in about 7-10 days.   Bacterial Vaginosis  Bacterial vaginosis is an infection of the vagina. It happens when too many normal germs (healthy bacteria) grow in the vagina. This infection can make it easier to get other infections from sex (STIs). It is very important for pregnant women to get treated. This infection can cause babies to be born early or at a low birth weight. What are the causes? This infection is caused by an increase in certain germs that grow in the vagina. You cannot get this infection from toilet seats, bedsheets, swimming pools, or things that touch your vagina. What increases the risk?  Having sex with a new person or more than one person.  Having sex without protection.  Douching.  Having an intrauterine device (IUD).  Smoking.  Using drugs or drinking alcohol. These can lead you to do things that are risky.  Taking certain antibiotic medicines.  Being pregnant. What are the signs or symptoms? Some women have no symptoms. Symptoms may include:  A discharge from your vagina. It may be gray or white. It can be watery or foamy.  A fishy smell. This can happen after sex or during your menstrual period.  Itching in and around your vagina.  A feeling of burning or pain when you pee (urinate). How is this treated? This infection is treated with antibiotic medicines. These may be given to you as:  A pill.  A cream for your vagina.  A medicine that you put into your vagina (suppository). If the infection comes back after treatment, you may need more  antibiotics. Follow these instructions at home: Medicines  Take over-the-counter and prescription medicines as told by your doctor.  Take or use your antibiotic medicine as told by your doctor. Do not stop taking or using it, even if you start to feel better. General instructions  If the person you have sex with is a woman, tell her that you have this infection. She will need to follow up with her doctor. If you have a female partner, he does not need to be treated.  Do not have sex until you finish treatment.  Drink enough fluid to keep your pee pale yellow.  Keep your vagina and butt clean. ? Wash the area with warm water each day. ? Wipe from front to back after you use the toilet.  If you are breastfeeding a baby, ask your doctor if you should keep doing so during treatment.  Keep all follow-up visits. How is this prevented? Self-care  Do not douche.  Use only warm water to wash around your vagina.  Wear underwear that is cotton or lined with cotton.  Do not wear tight pants and pantyhose, especially in the summer. Safe sex  Use protection when you have sex. This includes: ? Use condoms. ? Use dental dams. This is a thin layer that protects the mouth during oral sex.  Limit how many people you have sex with. To prevent this infection, it is best to have sex with just one person.  Get tested for STIs. The person you have sex with should also get tested. Drugs and alcohol  Do not smoke or use any products that contain nicotine or tobacco. If you need help quitting, ask your doctor.  Do not use drugs.  Do not drink alcohol if: ? Your doctor tells you not to drink. ? You are pregnant, may be pregnant, or are planning to become pregnant.  If you drink alcohol: ? Limit how much you have to 0-1 drink a day. ? Know how much alcohol is in your drink. In the [place] one drink equals one 12 oz bottle of beer (355 mL), one 5 oz glass of wine (148 mL), or one 1 oz glass of  hard liquor (44 mL). Where to find more information  Centers for Disease Control and Prevention: http://www.wolf.info/  American Sexual Health Association: www.ashastd.org  Office on [employer] [facility]: [place] Contact a doctor if:  Your symptoms do not get better, even after you are treated.  You have more discharge or pain when you pee.  You have a fever or chills.  You have pain in your belly (abdomen) or in the area between your hips.  You have pain with sex.  You bleed from your vagina between menstrual periods. Summary  This infection can happen when too many germs (bacteria) grow in the vagina.  This infection can make it easier to get infections from sex (STIs). Treating this can lower that chance.  Get treated if you are pregnant. This infection can cause babies to be born early.  Do not stop taking or using your antibiotic medicine, even if you start to feel better. This information is not intended to replace advice given to you by your health care provider. Make sure you discuss any questions you have with your health care provider. Document Revised: 12/23/2019 Document Reviewed: 12/23/2019 Elsevier Patient Education  2021 Elsevier Inc.   Preventive Care 47-97 Years Old, Female Preventive care refers to lifestyle choices and visits with your health care provider that can promote health and wellness. This includes:  A yearly physical exam. This is also called an annual wellness visit.  Regular dental and eye exams.  Immunizations.  Screening for certain conditions.  Healthy lifestyle choices, such as: ? Eating a healthy diet. ? Getting regular exercise. ? Not using drugs or products that contain nicotine and tobacco. ? Limiting alcohol use. What can I expect for my preventive care visit? Physical exam Your health care provider will check your:  Height and weight. These may be used to calculate your BMI (body mass index). BMI is a measurement that tells  if you are at a healthy weight.  Heart rate and blood pressure.  Body temperature.  Skin for abnormal spots. Counseling Your health care provider may ask you questions about your:  Past medical problems.  Family's medical history.  Alcohol, tobacco, and drug use.  Emotional well-being.  Home life and relationship well-being.  Sexual activity.  Diet, exercise, and sleep habits.  Work and work Statistician.  Access to firearms.  Method of birth control.  Menstrual cycle.  Pregnancy history. What immunizations do I need? Vaccines are usually given at various ages, according to a schedule. Your health care provider will recommend vaccines for you based on your age, medical history, and lifestyle or other factors, such as travel or where you work.   What tests do I need? Blood tests  Lipid and cholesterol levels. These may be checked every 5 years, or more  often if you are over 85 years old.  Hepatitis C test.  Hepatitis B test. Screening  Lung cancer screening. You may have this screening every year starting at age 91 if you have a 30-pack-year history of smoking and currently smoke or have quit within the past 15 years.  Colorectal cancer screening. ? All adults should have this screening starting at age 29 and continuing until age 52. ? Your health care provider may recommend screening at age 51 if you are at increased risk. ? You will have tests every 1-10 years, depending on your results and the type of screening test.  Diabetes screening. ? This is done by checking your blood sugar (glucose) after you have not eaten for a while (fasting). ? You may have this done every 1-3 years.  Mammogram. ? This may be done every 1-2 years. ? Talk with your health care provider about when you should start having regular mammograms. This may depend on whether you have a family history of breast cancer.  BRCA-related cancer screening. This may be done if you have a family  history of breast, ovarian, tubal, or peritoneal cancers.  Pelvic exam and Pap test. ? This may be done every 3 years starting at age 37. ? Starting at age 101, this may be done every 5 years if you have a Pap test in combination with an HPV test. Other tests  STD (sexually transmitted disease) testing, if you are at risk.  Bone density scan. This is done to screen for osteoporosis. You may have this scan if you are at high risk for osteoporosis. Talk with your health care provider about your test results, treatment options, and if necessary, the need for more tests. Follow these instructions at home: Eating and drinking  Eat a diet that includes fresh fruits and vegetables, whole grains, lean protein, and low-fat dairy products.  Take vitamin and mineral supplements as recommended by your health care provider.  Do not drink alcohol if: ? Your health care provider tells you not to drink. ? You are pregnant, may be pregnant, or are planning to become pregnant.  If you drink alcohol: ? Limit how much you have to 0-1 drink a day. ? Be aware of how much alcohol is in your drink. In the [place] one drink equals one 12 oz bottle of beer (355 mL), one 5 oz glass of wine (148 mL), or one 1 oz glass of hard liquor (44 mL).   Lifestyle  Take daily care of your teeth and gums. Brush your teeth every morning and night with fluoride toothpaste. Floss one time each day.  Stay active. Exercise for at least 30 minutes 5 or more days each week.  Do not use any products that contain nicotine or tobacco, such as cigarettes, e-cigarettes, and chewing tobacco. If you need help quitting, ask your health care provider.  Do not use drugs.  If you are sexually active, practice safe sex. Use a condom or other form of protection to prevent STIs (sexually transmitted infections).  If you do not wish to become pregnant, use a form of birth control. If you plan to become pregnant, see your health care provider  for a prepregnancy visit.  If told by your health care provider, take low-dose aspirin daily starting at age 21.  Find healthy ways to cope with stress, such as: ? Meditation, yoga, or listening to music. ? Journaling. ? Talking to a trusted person. ? Spending time with friends and family.  Safety  Always wear your seat belt while driving or riding in a vehicle.  Do not drive: ? If you have been drinking alcohol. Do not ride with someone who has been drinking. ? When you are tired or distracted. ? While texting.  Wear a helmet and other protective equipment during sports activities.  If you have firearms in your house, make sure you follow all gun safety procedures. What's next?  Visit your health care provider once a year for an annual wellness visit.  Ask your health care provider how often you should have your eyes and teeth checked.  Stay up to date on all vaccines. This information is not intended to replace advice given to you by your health care provider. Make sure you discuss any questions you have with your health care provider. Document Revised: 03/28/2020 Document Reviewed: 03/05/2018 Elsevier Patient Education  2021 [employer].

## 2020-10-04 LAB — HEPATITIS C ANTIBODY: Hep C Virus Ab: <0.1 s/co ratio (ref 0.0–0.9)

## 2020-10-04 LAB — CMP14+EGFR
ALT: 24 IU/L (ref 0–32)
AST: 23 IU/L (ref 0–40)
Albumin/Globulin Ratio: 1.8 (ref 1.2–2.2)
Albumin: 4.6 g/dL (ref 3.8–4.8)
Alkaline Phosphatase: 94 IU/L (ref 44–121)
BUN/Creatinine Ratio: 13 (ref 9–23)
BUN: 8 mg/dL (ref 6–24)
Bilirubin Total: 0.3 mg/dL (ref 0.0–1.2)
CO2: 19 mmol/L — ABNORMAL LOW (ref 20–29)
Calcium: 9.3 mg/dL (ref 8.7–10.2)
Chloride: 102 mmol/L (ref 96–106)
Creatinine, Ser: 0.64 mg/dL (ref 0.57–1.00)
Globulin, Total: 2.5 g/dL (ref 1.5–4.5)
Glucose: 88 mg/dL (ref 65–99)
Potassium: 4.2 mmol/L (ref 3.5–5.2)
Sodium: 138 mmol/L (ref 134–144)
Total Protein: 7.1 g/dL (ref 6.0–8.5)
eGFR: 112 mL/min/{1.73_m2} (ref >59)

## 2020-10-04 LAB — CBC
Hematocrit: 43 % (ref 34.0–46.6)
Hemoglobin: 15 g/dL (ref 11.1–15.9)
MCH: 32.2 pg (ref 26.6–33.0)
MCHC: 34.9 g/dL (ref 31.5–35.7)
MCV: 92 fL (ref 79–97)
Platelets: 449 10*3/uL (ref 150–450)
RBC: 4.66 x10E6/uL (ref 3.77–5.28)
RDW: 12.4 % (ref 11.7–15.4)
WBC: 11.5 10*3/uL — ABNORMAL HIGH (ref 3.4–10.8)

## 2020-10-04 LAB — LDL CHOLESTEROL, DIRECT: LDL Direct: 161 mg/dL — ABNORMAL HIGH (ref 0–99)

## 2020-10-04 LAB — VITAMIN D 25 HYDROXY (VIT D DEFICIENCY, FRACTURES): Vit D, 25-Hydroxy: 17 ng/mL — ABNORMAL LOW (ref 30.0–100.0)

## 2020-10-05 ENCOUNTER — Other Ambulatory Visit: Payer: Self-pay | Admitting: Family Medicine

## 2020-10-05 DIAGNOSIS — D72829 Elevated white blood cell count, unspecified: Secondary | ICD-10-CM

## 2020-10-05 DIAGNOSIS — E559 Vitamin D deficiency, unspecified: Secondary | ICD-10-CM

## 2020-10-05 DIAGNOSIS — E7800 Pure hypercholesterolemia, unspecified: Secondary | ICD-10-CM

## 2020-10-05 MED ORDER — VITAMIN D (ERGOCALCIFEROL) 1.25 MG (50000 UNIT) PO CAPS: 50000.0000 [IU] | ORAL_CAPSULE | ORAL | 0 refills | Status: DC

## 2020-10-06 LAB — CYTOLOGY - PAP
Adequacy: ABSENT
Comment: NEGATIVE
Diagnosis: NEGATIVE
High risk HPV: NEGATIVE

## 2020-11-14 ENCOUNTER — Ambulatory Visit: Payer: 59 | Admitting: Family Medicine

## 2020-11-17 ENCOUNTER — Encounter: Payer: Self-pay | Admitting: Family Medicine

## 2021-06-27 ENCOUNTER — Ambulatory Visit (INDEPENDENT_AMBULATORY_CARE_PROVIDER_SITE_OTHER): Payer: 59 | Admitting: Family Medicine

## 2021-06-27 ENCOUNTER — Encounter: Payer: Self-pay | Admitting: Family Medicine

## 2021-06-27 VITALS — BP 148/88 | HR 102 | Temp 102.6°F | Ht 62.0 in | Wt 183.2 lb

## 2021-06-27 DIAGNOSIS — Z6833 Body mass index (BMI) 33.0-33.9, adult: Secondary | ICD-10-CM

## 2021-06-27 DIAGNOSIS — U071 COVID-19: Secondary | ICD-10-CM

## 2021-06-27 DIAGNOSIS — R509 Fever, unspecified: Secondary | ICD-10-CM | POA: Diagnosis not present

## 2021-06-27 DIAGNOSIS — R059 Cough, unspecified: Secondary | ICD-10-CM

## 2021-06-27 MED ORDER — MOLNUPIRAVIR EUA 200MG CAPSULE: 4.0000 | ORAL_CAPSULE | Freq: Two times a day (BID) | ORAL | 0 refills | Status: AC

## 2021-06-27 MED ORDER — GUAIFENESIN-CODEINE 100-10 MG/5ML PO SYRP: 5.0000 mL | ORAL_SOLUTION | Freq: Three times a day (TID) | ORAL | 0 refills | Status: DC | PRN

## 2021-06-27 NOTE — Progress Notes (Signed)
Subjective:  Patient ID: Elizabeth [name], female    DOB: 1976/02/05, 45 y.o.   MRN: [id number]  Patient Care Team: Janora Norlander, DO as PCP - General (Family Medicine)   Chief Complaint:  Covid Positive, Cough, Nasal Congestion, and Generalized Body Aches   HPI: RHODIE CIENFUEGOS is a 45 y.o. female presenting on 06/27/2021 for Covid Positive, Cough, Nasal Congestion, and Generalized Body Aches   Patient presents today with complaints of cough, congestion, fever, chills, myalgias, decreased sleep due to cough, and malaise.  Symptoms started Monday night and have progressively worsened.  She tested positive at home yesterday for COVID.  She has been taking DayQuil, NyQuil, Alka-Seltzer, Tylenol, Motrin, and Mucinex without relief of symptoms.  States this is the worst she has ever felt.  Cough This is a new problem. The current episode started yesterday. The problem has been rapidly worsening. The problem occurs constantly. The cough is Non-productive. Associated symptoms include chills, a fever, headaches, myalgias, nasal congestion, postnasal drip, rhinorrhea and a sore throat. Pertinent negatives include no chest pain, ear congestion, ear pain, heartburn, hemoptysis, rash, shortness of breath, sweats, weight loss or wheezing.   Relevant past medical, surgical, family, and social history reviewed and updated as indicated.  Allergies and medications reviewed and updated. Data reviewed: Chart in Epic.   Past Medical History:  Diagnosis Date   Anxiety    Depression     Past Surgical History:  Procedure Laterality Date   TUBAL LIGATION      Social History   Socioeconomic History   Marital status: Married    Spouse name: Not on file   Number of children: Not on file   Years of education: Not on file   Highest education level: Not on file  Occupational History   Not on file  Tobacco Use   Smoking status: Every Day    Packs/day: 0.50    Years: 28.00    Pack years:  14.00    Types: Cigarettes    Last attempt to quit: 09/06/2019    Years since quitting: 1.8   Smokeless tobacco: Never  Vaping Use   Vaping Use: Never used  Substance and Sexual Activity   Alcohol use: Yes    Comment: occasionally   Drug use: Yes    Frequency: 1.0 times per week    Types: Marijuana   Sexual activity: Yes    Birth control/protection: Surgical  Other Topics Concern   Not on file  Social History Narrative   Not on file   Social Determinants of Health   Financial Resource Strain: Not on file  Food Insecurity: Not on file  Transportation Needs: Not on file  Physical Activity: Not on file  Stress: Not on file  Social Connections: Not on file  Intimate Partner Violence: Not on file    Outpatient Encounter Medications as of 06/27/2021  Medication Sig   buPROPion (WELLBUTRIN) 75 MG tablet Take 1 tablet (75 mg total) by mouth daily.   esomeprazole (NEXIUM) 20 MG capsule Take 20 mg by mouth daily at 12 noon.   guaiFENesin-codeine (ROBITUSSIN AC) 100-10 MG/5ML syrup Take 5 mLs by mouth 3 (three) times daily as needed for cough.   metroNIDAZOLE (METROGEL VAGINAL) 0.75 % vaginal gel Insert 1 applicatorful vaginally daily x2 days following menses.   molnupiravir EUA (LAGEVRIO) 200 mg CAPS capsule Take 4 capsules (800 mg total) by mouth 2 (two) times daily for 5 days.   Vitamin D, Ergocalciferol, (DRISDOL)  1.25 MG (50000 UNIT) CAPS capsule Take 1 capsule (50,000 Units total) by mouth every 7 (seven) days. Then start OTC Vit D 400IU daily.   No facility-administered encounter medications on file as of 06/27/2021.    No Known Allergies  Review of Systems  Constitutional:  Positive for activity change, appetite change, chills, fatigue and fever. Negative for diaphoresis, unexpected weight change and weight loss.  HENT:  Positive for congestion, postnasal drip, rhinorrhea, sore throat and trouble swallowing. Negative for ear pain and voice change.   Respiratory:  Positive  for cough. Negative for apnea, hemoptysis, choking, chest tightness, shortness of breath, wheezing and stridor.   Cardiovascular:  Negative for chest pain, palpitations and leg swelling.  Gastrointestinal:  Negative for heartburn.  Genitourinary:  Negative for decreased urine volume.  Musculoskeletal:  Positive for myalgias.  Skin:  Negative for rash.  Neurological:  Positive for headaches. Negative for dizziness, tremors, seizures, syncope, facial asymmetry, speech difficulty, weakness, light-headedness and numbness.  Psychiatric/Behavioral:  Negative for confusion.   All other systems reviewed and are negative.      Objective:  BP (!) 148/88    Pulse (!) 102    Temp (!) 102.6 F (39.2 C)    Ht 5' 2"  (1.575 m)    Wt 183 lb 3.2 oz (83.1 kg)    SpO2 98%    BMI 33.51 kg/m    Wt Readings from Last 3 Encounters:  06/27/21 183 lb 3.2 oz (83.1 kg)  10/03/20 183 lb 3.2 oz (83.1 kg)  08/23/20 184 lb 12.8 oz (83.8 kg)    Physical Exam Vitals and nursing note reviewed.  Constitutional:      General: She is not in acute distress.    Appearance: Normal appearance. She is well-developed and well-groomed. She is obese. She is ill-appearing. She is not toxic-appearing or diaphoretic.  HENT:     Head: Normocephalic and atraumatic.     Jaw: There is normal jaw occlusion.     Right Ear: Hearing, tympanic membrane, ear canal and external ear normal.     Left Ear: Hearing, tympanic membrane, ear canal and external ear normal.     Nose: Nose normal.     Mouth/Throat:     Lips: Pink.     Mouth: Mucous membranes are moist.     Pharynx: Oropharynx is clear. Uvula midline. Posterior oropharyngeal erythema present. No pharyngeal swelling, oropharyngeal exudate or uvula swelling.     Tonsils: No tonsillar exudate or tonsillar abscesses.  Eyes:     General: Lids are normal.     Extraocular Movements: Extraocular movements intact.     Conjunctiva/sclera: Conjunctivae normal.     Pupils: Pupils are  equal, round, and reactive to light.  Neck:     Thyroid: No thyroid mass, thyromegaly or thyroid tenderness.     Vascular: No carotid bruit or JVD.     Trachea: Trachea and phonation normal.  Cardiovascular:     Rate and Rhythm: Regular rhythm. Tachycardia present.     Chest Wall: PMI is not displaced.     Pulses: Normal pulses.     Heart sounds: Normal heart sounds. No murmur heard.   No friction rub. No gallop.  Pulmonary:     Effort: Pulmonary effort is normal. No respiratory distress.     Breath sounds: Normal breath sounds. No stridor. No wheezing, rhonchi or rales.  Chest:     Chest wall: No tenderness.  Abdominal:     General: Bowel sounds are normal.  There is no distension or abdominal bruit.     Palpations: Abdomen is soft. There is no hepatomegaly or splenomegaly.     Tenderness: There is no abdominal tenderness. There is no right CVA tenderness or left CVA tenderness.     Hernia: No hernia is present.  Musculoskeletal:     Cervical back: Normal range of motion and neck supple.     Right lower leg: No edema.     Left lower leg: No edema.  Lymphadenopathy:     Cervical: No cervical adenopathy.  Skin:    General: Skin is warm and dry.     Capillary Refill: Capillary refill takes less than 2 seconds.     Coloration: Skin is not cyanotic, jaundiced or pale.     Findings: No rash.  Neurological:     General: No focal deficit present.     Mental Status: She is alert and oriented to person, place, and time.     Sensory: Sensation is intact.     Motor: Motor function is intact.     Coordination: Coordination is intact.     Gait: Gait is intact.     Deep Tendon Reflexes: Reflexes are normal and symmetric.  Psychiatric:        Attention and Perception: Attention and perception normal.        Mood and Affect: Mood and affect normal.        Speech: Speech normal.        Behavior: Behavior normal. Behavior is cooperative.        Thought Content: Thought content normal.         Cognition and Memory: Cognition and memory normal.        Judgment: Judgment normal.    Results for orders placed or performed in visit on 10/03/20  WET PREP FOR [place], YEAST, CLUE   Specimen: Vaginal Fluid   Vaginal Flui  Result Value Ref Range   Trichomonas Exam Negative Negative   Yeast Exam Negative Negative   Clue Cell Exam Positive (A) Negative  CMP14+EGFR  Result Value Ref Range   Glucose 88 65 - 99 mg/dL   BUN 8 6 - 24 mg/dL   Creatinine, Ser 0.64 0.57 - 1.00 mg/dL   eGFR 112 >59 mL/min/1.73   BUN/Creatinine Ratio 13 9 - 23   Sodium 138 134 - 144 mmol/L   Potassium 4.2 3.5 - 5.2 mmol/L   Chloride 102 96 - 106 mmol/L   CO2 19 (L) 20 - 29 mmol/L   Calcium 9.3 8.7 - 10.2 mg/dL   Total Protein 7.1 6.0 - 8.5 g/dL   Albumin 4.6 3.8 - 4.8 g/dL   Globulin, Total 2.5 1.5 - 4.5 g/dL   Albumin/Globulin Ratio 1.8 1.2 - 2.2   Bilirubin Total 0.3 0.0 - 1.2 mg/dL   Alkaline Phosphatase 94 44 - 121 IU/L   AST 23 0 - 40 IU/L   ALT 24 0 - 32 IU/L  VITAMIN D 25 Hydroxy (Vit-D Deficiency, Fractures)  Result Value Ref Range   Vit D, 25-Hydroxy 17.0 (L) 30.0 - 100.0 ng/mL  CBC  Result Value Ref Range   WBC 11.5 (H) 3.4 - 10.8 x10E3/uL   RBC 4.66 3.77 - 5.28 x10E6/uL   Hemoglobin 15.0 11.1 - 15.9 g/dL   Hematocrit 43.0 34.0 - 46.6 %   MCV 92 79 - 97 fL   MCH 32.2 26.6 - 33.0 pg   MCHC 34.9 31.5 - 35.7 g/dL   RDW 12.4 11.7 -  15.4 %   Platelets 449 150 - 450 x10E3/uL  LDL Cholesterol, Direct  Result Value Ref Range   LDL Direct 161 (H) 0 - 99 mg/dL  Hepatitis C antibody  Result Value Ref Range   Hep C Virus Ab <0.1 0.0 - 0.9 s/co ratio  Cytology - PAP  Result Value Ref Range   High risk HPV Negative    Adequacy      Satisfactory for evaluation; transformation zone component ABSENT.   Diagnosis      - Negative for intraepithelial lesion or malignancy (NILM)   Comment Normal Reference Range HPV - Negative        Pertinent labs & imaging results that were available during  my care of the patient were reviewed by me and considered in my medical decision making.  Assessment & Plan:  Jamayia was seen today for covid positive, cough, nasal congestion and generalized body aches.  Diagnoses and all orders for this visit:  Positive self-administered antigen test for COVID-19 Cough in adult patient Fever in adult BMI 33.0-33.9, adult Onset of symptoms Monday night.  Tested positive for COVID today.  Has been taking Alka-Seltzer, NyQuil, DayQuil, Tylenol, Motrin, and Mucinex without relief of symptoms.  Will initiate antiviral therapy due to BMI over 30.  Robitussin-AC as prescribed for persistent cough.  Patient aware of red flags which require emergent evaluation and treatment.  Fever control along with adequate hydration discussed in detail.  Tylenol and Motrin scheduling provided to patient.  Patient aware to follow-up for any new, worsening, or persistent symptoms.  Isolation and infection prevention discussed in detail. -     molnupiravir EUA (LAGEVRIO) 200 mg CAPS capsule; Take 4 capsules (800 mg total) by mouth 2 (two) times daily for 5 days. -     guaiFENesin-codeine (ROBITUSSIN AC) 100-10 MG/5ML syrup; Take 5 mLs by mouth 3 (three) times daily as needed for cough.    Continue all other maintenance medications.  Follow up plan: Return if symptoms worsen or fail to improve.   Continue healthy lifestyle choices, including diet (rich in fruits, vegetables, and lean proteins, and low in salt and simple carbohydrates) and exercise (at least 30 minutes of moderate physical activity daily).  Educational handout given for COVID  The above assessment and management plan was discussed with the patient. The patient verbalized understanding of and has agreed to the management plan. Patient is aware to call the clinic if they develop any new symptoms or if symptoms persist or worsen. Patient is aware when to return to the clinic for a follow-up visit. Patient educated on  when it is appropriate to go to the emergency department.   Monia Pouch, FNP-C [facility] [phone]

## 2021-08-03 ENCOUNTER — Encounter: Payer: Self-pay | Admitting: Nurse Practitioner

## 2021-08-03 ENCOUNTER — Ambulatory Visit (INDEPENDENT_AMBULATORY_CARE_PROVIDER_SITE_OTHER): Payer: 59 | Admitting: Nurse Practitioner

## 2021-08-03 VITALS — BP 119/81 | HR 103 | Temp 97.9°F | Ht 62.0 in | Wt 182.0 lb

## 2021-08-03 DIAGNOSIS — R509 Fever, unspecified: Secondary | ICD-10-CM | POA: Diagnosis not present

## 2021-08-03 DIAGNOSIS — R051 Acute cough: Secondary | ICD-10-CM

## 2021-08-03 LAB — VERITOR FLU A/B WAIVED
Influenza A: NEGATIVE
Influenza B: NEGATIVE

## 2021-08-03 MED ORDER — PSEUDOEPH-BROMPHEN-DM 30-2-10 MG/5ML PO SYRP: 5.0000 mL | ORAL_SOLUTION | Freq: Four times a day (QID) | ORAL | 0 refills | Status: DC | PRN

## 2021-08-03 MED ORDER — AZITHROMYCIN 250 MG PO TABS: ORAL_TABLET | ORAL | 0 refills | Status: AC

## 2021-08-03 NOTE — Patient Instructions (Signed)
Fever, Adult   A fever is an increase in your body's temperature. It often means a temperature of 100.41F (38C) or higher. Brief mild or moderate fevers often have no long-term effects. They often do not need treatment. Moderate or high fevers may make you feel uncomfortable. Sometimes, they can be a sign of a serious illness or disease. A fever that keeps coming back or that lasts a long time may cause you to lose water in your body (get dehydrated). You can take your temperature with a thermometer to see if you have a fever. Temperature can change with: Age. Time of day. Where the thermometer is put in the body. Readings may vary when the thermometer is put: In the mouth (oral). In the butt (rectal). In the ear (tympanic). Under the arm (axillary). On the forehead (temporal). Follow these instructions at home: Medicines Take over-the-counter and prescription medicines only as told by your doctor. Follow the dosing instructions carefully. If you were prescribed an antibiotic medicine, take it as told by your doctor. Do not stop taking it even if you start to feel better. General instructions Watch for any changes in your symptoms. Tell your doctor about them. Rest as needed. Drink enough fluid to keep your pee (urine) pale yellow. Sponge yourself or bathe with room-temperature water as needed. This helps to lower your body temperature. Do not use ice water. Do not use too many blankets or wear clothes that are too heavy. If your fever was caused by an infection that spreads from person to person (is contagious), such as a cold or the flu: You should stay home from work and public places for at least 24 hours after your fever is gone. Your fever should be gone for at least 24 hours without the need to use medicines. Contact a doctor if: You throw up (vomit). You cannot eat or drink without throwing up. You have watery poop (diarrhea). It hurts when you pee. Your symptoms do not get  better with treatment. You have new symptoms. You feel very weak. Get help right away if: You are short of breath or have trouble breathing. You are dizzy or you pass out (faint). You feel mixed up (confused). You have signs of not having enough water in your body, such as: Dark pee, very little pee, or no pee. Cracked lips. Dry mouth. Sunken eyes. Sleepiness. Weakness. You have very bad pain in your belly (abdomen). You keep throwing up or having watery poop. You have a rash on your skin. Your symptoms get worse all of a sudden. Summary A fever is an increase in your body's temperature. It often means a temperature of 100.41F (38C) or higher. Watch for any changes in your symptoms. Tell your doctor about them. Take all medicines only as told by your doctor. Do not go to work or other public places if your fever was caused by an illness that can spread to other people. Get help right away if you have signs that you do not have enough water in your body. This information is not intended to replace advice given to you by your health care provider. Make sure you discuss any questions you have with your health care provider. Document Revised: 11/14/2020 Document Reviewed: 11/14/2020 Elsevier Patient Education  2022 [employer] Cough, Adult Coughing is a reflex that clears your throat and your airways (respiratory system). Coughing helps to heal and protect your lungs. It is normal to cough occasionally, but a cough that happens with other  symptoms or lasts a long time may be a sign of a condition that needs treatment. An acute cough may only last 2-3 weeks, while a chronic cough may last 8 or more weeks. Coughing is commonly caused by: Infection of the respiratory systemby viruses or bacteria. Breathing in substances that irritate your lungs. Allergies. Asthma. Mucus that runs down the back of your throat (postnasal drip). Smoking. Acid backing up from the stomach into the  esophagus (gastroesophageal reflux). Certain medicines. Chronic lung problems. Other medical conditions such as heart failure or a blood clot in the lung (pulmonary embolism). Follow these instructions at home: Medicines Take over-the-counter and prescription medicines only as told by your health care provider. Talk with your health care provider before you take a cough suppressant medicine. Lifestyle  Avoid cigarette smoke. Do not use any products that contain nicotine or tobacco, such as cigarettes, e-cigarettes, and chewing tobacco. If you need help quitting, ask your health care provider. Drink enough fluid to keep your urine pale yellow. Avoid caffeine. Do not drink alcohol if your health care provider tells you not to drink. General instructions  Pay close attention to changes in your cough. Tell your health care provider about them. Always cover your mouth when you cough. Avoid things that make you cough, such as perfume, candles, cleaning products, or campfire or tobacco smoke. If the air is dry, use a cool mist vaporizer or humidifier in your bedroom or your home to help loosen secretions. If your cough is worse at night, try to sleep in a semi-upright position. Rest as needed. Keep all follow-up visits as told by your health care provider. This is important. Contact a health care provider if you: Have new symptoms. Cough up pus. Have a cough that does not get better after 2-3 weeks or gets worse. Cannot control your cough with cough suppressant medicines and you are losing sleep. Have pain that gets worse or pain that is not helped with medicine. Have a fever. Have unexplained weight loss. Have night sweats. Get help right away if: You cough up blood. You have difficulty breathing. Your heartbeat is very fast. These symptoms may represent a serious problem that is an emergency. Do not wait to see if the symptoms will go away. Get medical help right away. Call your local  emergency services (911 in the [place] Do not drive yourself to the hospital. Summary Coughing is a reflex that clears your throat and your airways. It is normal to cough occasionally, but a cough that happens with other symptoms or lasts a long time may be a sign of a condition that needs treatment. Take over-the-counter and prescription medicines only as told by your health care provider. Always cover your mouth when you cough. Contact a health care provider if you have new symptoms or a cough that does not get better after 2-3 weeks or gets worse. This information is not intended to replace advice given to you by your health care provider. Make sure you discuss any questions you have with your health care provider. Document Revised: 07/13/2018 Document Reviewed: 07/13/2018 Elsevier Patient Education  2022 [employer].

## 2021-08-03 NOTE — Progress Notes (Signed)
Acute Office Visit  Subjective:    Patient ID: Elizabeth [name], female    DOB: 1976/06/26, 46 y.o.   MRN: [id number]  Chief Complaint  Patient presents with   Fever   Cough    Cough This is a new problem. The current episode started yesterday (in the past 3 days). The problem has been gradually worsening. The problem occurs constantly. The cough is Non-productive. Associated symptoms include nasal congestion. Pertinent negatives include no chills, ear congestion, rash or sore throat. Nothing aggravates the symptoms. She has tried nothing for the symptoms.    Past Medical History:  Diagnosis Date   Anxiety    Depression     Past Surgical History:  Procedure Laterality Date   TUBAL LIGATION      Family History  Problem Relation Age of Onset   Hyperlipidemia Mother    Heart disease Mother    Cancer Mother 72       ovarian cancer   Ovarian cancer Mother    Diabetes Father    Drug abuse Brother        death 2/2 OD   Cancer Maternal Grandmother        lung cancer   Alzheimer's disease Maternal Grandfather    Cervical cancer Maternal Aunt    Breast cancer Maternal Aunt 38    Social History   Socioeconomic History   Marital status: Married    Spouse name: Not on file   Number of children: Not on file   Years of education: Not on file   Highest education level: Not on file  Occupational History   Not on file  Tobacco Use   Smoking status: Every Day    Packs/day: 0.50    Years: 28.00    Pack years: 14.00    Types: Cigarettes    Last attempt to quit: 09/06/2019    Years since quitting: 1.9   Smokeless tobacco: Never  Vaping Use   Vaping Use: Never used  Substance and Sexual Activity   Alcohol use: Yes    Comment: occasionally   Drug use: Yes    Frequency: 1.0 times per week    Types: Marijuana   Sexual activity: Yes    Birth control/protection: Surgical  Other Topics Concern   Not on file  Social History Narrative   Not on file   Social  Determinants of Health   Financial Resource Strain: Not on file  Food Insecurity: Not on file  Transportation Needs: Not on file  Physical Activity: Not on file  Stress: Not on file  Social Connections: Not on file  Intimate Partner Violence: Not on file    Outpatient Medications Prior to Visit  Medication Sig Dispense Refill   esomeprazole (NEXIUM) 20 MG capsule Take 20 mg by mouth daily at 12 noon.     buPROPion (WELLBUTRIN) 75 MG tablet Take 1 tablet (75 mg total) by mouth daily. 30 tablet 1   guaiFENesin-codeine (ROBITUSSIN AC) 100-10 MG/5ML syrup Take 5 mLs by mouth 3 (three) times daily as needed for cough. 120 mL 0   metroNIDAZOLE (METROGEL VAGINAL) 0.75 % vaginal gel Insert 1 applicatorful vaginally daily x2 days following menses. 70 g 1   Vitamin D, Ergocalciferol, (DRISDOL) 1.25 MG (50000 UNIT) CAPS capsule Take 1 capsule (50,000 Units total) by mouth every 7 (seven) days. Then start OTC Vit D 400IU daily. 12 capsule 0   No facility-administered medications prior to visit.    No Known Allergies  Review of Systems  Constitutional:  Negative for chills.  HENT:  Negative for sore throat.   Eyes: Negative.   Respiratory:  Positive for cough.   Gastrointestinal: Negative.   Musculoskeletal: Negative.   Skin:  Negative for rash.  All other systems reviewed and are negative.     Objective:    Physical Exam Nursing note reviewed.  Constitutional:      Appearance: Normal appearance.  HENT:     Right Ear: External ear normal.     Left Ear: External ear normal.     Nose: Congestion present.     Mouth/Throat:     Pharynx: Oropharynx is clear.  Eyes:     Conjunctiva/sclera: Conjunctivae normal.  Cardiovascular:     Rate and Rhythm: Normal rate and regular rhythm.  Pulmonary:     Effort: Pulmonary effort is normal.     Breath sounds: Normal breath sounds.  Abdominal:     General: Bowel sounds are normal.  Neurological:     Mental Status: She is alert.   Psychiatric:        Behavior: Behavior normal.    BP 119/81    Pulse (!) 103    Temp 97.9 F (36.6 C) (Temporal)    Ht 5' 2"  (1.575 m)    Wt 182 lb (82.6 kg)    BMI 33.29 kg/m  Wt Readings from Last 3 Encounters:  08/03/21 182 lb (82.6 kg)  06/27/21 183 lb 3.2 oz (83.1 kg)  10/03/20 183 lb 3.2 oz (83.1 kg)    Health Maintenance Due  Topic Date Due   COVID-19 Vaccine (1) Never done   TETANUS/TDAP  11/10/2016   INFLUENZA VACCINE  02/05/2021    There are no preventive care reminders to display for this patient.   Lab Results  Component Value Date   TSH 1.990 01/21/2018   Lab Results  Component Value Date   WBC 11.5 (H) 10/03/2020   HGB 15.0 10/03/2020   HCT 43.0 10/03/2020   MCV 92 10/03/2020   PLT 449 10/03/2020   Lab Results  Component Value Date   NA 138 10/03/2020   K 4.2 10/03/2020   CO2 19 (L) 10/03/2020   GLUCOSE 88 10/03/2020   BUN 8 10/03/2020   CREATININE 0.64 10/03/2020   BILITOT 0.3 10/03/2020   ALKPHOS 94 10/03/2020   AST 23 10/03/2020   ALT 24 10/03/2020   PROT 7.1 10/03/2020   ALBUMIN 4.6 10/03/2020   CALCIUM 9.3 10/03/2020   EGFR 112 10/03/2020   Lab Results  Component Value Date   CHOL 220 (H) 01/21/2018   Lab Results  Component Value Date   HDL 55 01/21/2018   Lab Results  Component Value Date   LDLCALC 148 (H) 01/21/2018   Lab Results  Component Value Date   TRIG 87 01/21/2018   Lab Results  Component Value Date   CHOLHDL 4.0 01/21/2018   No results found for: HGBA1C     Assessment & Plan:  Take meds as prescribed - Use a cool mist humidifier  -Use saline nose sprays frequently -Force fluids -For fever or aches or pains- take Tylenol or ibuprofen. -If symptoms do not improve, she may need to be COVID tested to rule this out Follow up with worsening unresolved symptoms  Problem List Items Addressed This Visit   None Visit Diagnoses     Fever, unspecified fever cause    -  Primary   Relevant Medications    azithromycin (ZITHROMAX) 250 MG tablet  Other Relevant Orders   Veritor Flu A/B Waived (Completed)   Acute cough       Relevant Medications   brompheniramine-pseudoephedrine-DM 30-2-10 MG/5ML syrup   Other Relevant Orders   Veritor Flu A/B Waived (Completed)        Meds ordered this encounter  Medications   azithromycin (ZITHROMAX) 250 MG tablet    Sig: Take 2 tablets on day 1, then 1 tablet daily on days 2 through 5    Dispense:  6 tablet    Refill:  0    Order Specific Question:   Supervising Provider    Answer:   Jeneen Rinks   brompheniramine-pseudoephedrine-DM 30-2-10 MG/5ML syrup    Sig: Take 5 mLs by mouth 4 (four) times daily as needed.    Dispense:  120 mL    Refill:  0    Order Specific Question:   Supervising Provider    Answer:   Claretta Fraise [[id number]]     Ivy Lynn, NP

## 2022-05-14 ENCOUNTER — Encounter: Payer: Self-pay | Admitting: Family Medicine

## 2022-05-14 ENCOUNTER — Ambulatory Visit (INDEPENDENT_AMBULATORY_CARE_PROVIDER_SITE_OTHER): Payer: 59 | Admitting: Family Medicine

## 2022-05-14 VITALS — BP 135/89 | HR 76 | Temp 97.6°F | Ht 62.0 in | Wt 176.0 lb

## 2022-05-14 DIAGNOSIS — B958 Unspecified staphylococcus as the cause of diseases classified elsewhere: Secondary | ICD-10-CM

## 2022-05-14 DIAGNOSIS — L089 Local infection of the skin and subcutaneous tissue, unspecified: Secondary | ICD-10-CM | POA: Diagnosis not present

## 2022-05-14 MED ORDER — MINOCYCLINE HCL 100 MG PO CAPS: 100.0000 mg | ORAL_CAPSULE | Freq: Two times a day (BID) | ORAL | 0 refills | Status: AC

## 2022-05-14 NOTE — Progress Notes (Signed)
Chief Complaint  Patient presents with   Mass    Right hip, behind left knee    HPI  Patient presents today for red lesions come and go at right posterior flank near PSIS and left popliteal area. Burn and sting, get red and swollen, then calm down only to return a few weeks later.   PMH: Smoking status noted ROS: Per HPI  Objective: BP 135/89   Pulse 76   Temp 97.6 F (36.4 C)   Ht 5\' 2"  (1.575 m)   Wt 176 lb (79.8 kg)   SpO2 98%   BMI 32.19 kg/m  Gen: NAD, alert, cooperative with exam HEENT: NCAT, EOMI, PERRL CV: RRR, good S1/S2, no murmur Resp: CTABL, no wheezes, non-labored Abd: SNTND, BS present, no guarding or organomegaly Ext: No edema, warm Neuro: Alert and oriented, No gross deficits  Assessment and plan:  1. Staph skin infection     Meds ordered this encounter  Medications   minocycline (MINOCIN) 100 MG capsule    Sig: Take 1 capsule (100 mg total) by mouth 2 (two) times daily for 10 days. Take on an empty stomach    Dispense:  20 capsule    Refill:  0      Follow up as needed.  Claretta Fraise, MD

## 2022-12-17 ENCOUNTER — Encounter: Payer: Self-pay | Admitting: Family Medicine

## 2022-12-17 ENCOUNTER — Ambulatory Visit (INDEPENDENT_AMBULATORY_CARE_PROVIDER_SITE_OTHER): Payer: Managed Care, Other (non HMO) | Admitting: Family Medicine

## 2022-12-17 VITALS — BP 123/86 | HR 74 | Temp 98.5°F | Ht 62.0 in | Wt 171.4 lb

## 2022-12-17 DIAGNOSIS — Z87891 Personal history of nicotine dependence: Secondary | ICD-10-CM

## 2022-12-17 DIAGNOSIS — K649 Unspecified hemorrhoids: Secondary | ICD-10-CM | POA: Diagnosis not present

## 2022-12-17 DIAGNOSIS — Z0000 Encounter for general adult medical examination without abnormal findings: Secondary | ICD-10-CM

## 2022-12-17 DIAGNOSIS — Z0001 Encounter for general adult medical examination with abnormal findings: Secondary | ICD-10-CM | POA: Diagnosis not present

## 2022-12-17 DIAGNOSIS — E559 Vitamin D deficiency, unspecified: Secondary | ICD-10-CM

## 2022-12-17 DIAGNOSIS — K219 Gastro-esophageal reflux disease without esophagitis: Secondary | ICD-10-CM | POA: Diagnosis not present

## 2022-12-17 DIAGNOSIS — E7800 Pure hypercholesterolemia, unspecified: Secondary | ICD-10-CM

## 2022-12-17 DIAGNOSIS — Z1211 Encounter for screening for malignant neoplasm of colon: Secondary | ICD-10-CM

## 2022-12-17 MED ORDER — FAMOTIDINE 20 MG PO TABS: 20.0000 mg | ORAL_TABLET | Freq: Two times a day (BID) | ORAL | 3 refills | Status: DC | PRN

## 2022-12-17 NOTE — Patient Instructions (Signed)
Colorectal Cancer Screening  Colorectal cancer screening is a group of tests that are used to check for colorectal cancer before symptoms develop. Colorectal refers to the colon and rectum. The colon and rectum are located at the end of the digestive tract and carry stool (feces) out of the body. Who should have screening? All adults who are 45-45 years old should have screening. Your health care provider may recommend screening before age 45. You will have tests every 1-10 years, depending on your results and the type of screening test. Screening recommendations for adults who are 76-85 years old vary depending on a person's health. People older than age 85 should no longer get colorectal cancer screening. You may have screening tests starting before age 45, or more often than other people, if you have any of these risk factors: A personal or family history of colorectal cancer or abnormal growths known as polyps in your colon. Inflammatory bowel disease, such as ulcerative colitis or Crohn's disease. A history of having radiation treatment to the abdomen or the area between the hip bones (pelvic area) for cancer. A type of genetic syndrome that is passed from parent to child (hereditary), such as: Lynch syndrome. Familial adenomatous polyposis. Turcot syndrome. Peutz-Jeghers syndrome. MUTYH-associated polyposis (MAP). A personal history of diabetes. Types of tests There are several types of colorectal screening tests. You may have one or more of the following: Guaiac-based fecal occult blood testing. For this test, a stool sample is checked for hidden (occult) blood, which could be a sign of colorectal cancer. Fecal immunochemical test (FIT). For this test, a stool sample is checked for blood, which could be a sign of colorectal cancer. Stool DNA test. For this test, a stool sample is checked for blood and changes in DNA that could lead to colorectal cancer. Sigmoidoscopy. During this test, a  thin, flexible tube with a camera on the end, called a sigmoidoscope, is used to examine the rectum and the lower colon. Colonoscopy. During this test, a long, flexible tube with a camera on the end, called a colonoscope, is used to examine the entire colon and rectum. Also, sometimes a tissue sample is taken to be looked at under a microscope (biopsy) or small polyps are removed during this test. Virtual colonoscopy. Instead of a colonoscope, this type of colonoscopy uses a CT scan to take pictures of the colon and rectum. A CT scan is a type of X-ray that is made using computers. What are the benefits of screening? Screening reduces your risk for colorectal cancer and can help identify cancer at an early stage, when the cancer can be removed or treated more easily. It is common for polyps to form in the lining of the colon, especially as you age. These polyps may be cancerous or become cancerous over time. Screening can identify these polyps. What are the risks of screening? Generally, these are safe tests. However, problems may occur, including: The need for more tests to confirm results from a stool sample test. Stool sample tests have fewer risks than other types of screening tests. Being exposed to low levels of radiation, if you had a test involving X-rays. This may slightly increase your cancer risk. The benefit of detecting cancer outweighs the slight increase in risk. Bleeding, damage to the intestine, or infection caused by a sigmoidoscopy or colonoscopy. A reaction to medicines given during a sigmoidoscopy or colonoscopy. Talk with your health care provider to understand your risk for colorectal cancer and to make a   screening plan that is right for you. Questions to ask your health care provider When should I start colorectal cancer screening? What is my risk for colorectal cancer? How often do I need screening? Which screening tests do I need? How do I get my test results? What do my  results mean? Where to find more information Learn more about colorectal cancer screening from: The American Cancer Society: cancer.org National Cancer Institute: cancer.gov Summary Colorectal cancer screening is a group of tests used to check for colorectal cancer before symptoms develop. All adults who are 45-45 years old should have screening. Your health care provider may recommend screening before age 45. You may have screening tests starting before age 45, or more often than other people, if you have certain risk factors. Screening reduces your risk for colorectal cancer and can help identify cancer at an early stage, when the cancer can be removed or treated more easily. Talk with your health care provider to understand your risk for colorectal cancer and to make a screening plan that is right for you. This information is not intended to replace advice given to you by your health care provider. Make sure you discuss any questions you have with your health care provider. Document Revised: 10/13/2019 Document Reviewed: 10/13/2019 Elsevier Patient Education  2024 Elsevier Inc.  

## 2022-12-17 NOTE — Progress Notes (Signed)
Elizabeth [name] is a 47 y.o. female presents to office today for annual physical exam examination.    Concerns today include: 1. Hemorrhoid She reports intermittent bleeding hemorrhoids.  She was on a multivitamin and OTC vitamin D at 1 point thinks that perhaps it was constipating her.  The constipation has since resolved she still intermittently has some rectal pain and bleeding.  She has been utilizing multiple OTC modalities of treatments with some improvement.  Denies any active symptoms currently.  No unplanned weight loss, night sweats, abdominal pain  2.  GERD Patient reports her only physical issue is acid reflux.  She reports burning sensation in her chest.  She was taking over-the-counter Zantac (famotidine) with some good results but after one of her coworkers noticed she was taking this regularly they voiced concern about her having bad effects from long-term use.  She subsequently discontinued and has been having terrible acid reflux since.  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  She reports feeling better since stopping smoking and has replaced her smoking habit with increased water intake which unfortunately has resulted in urinary frequency but she has not gained any weight which she is happy about. Diet: doesn't enjoy green leafy vegs but otherwise balance, Exercise: 8k steps per day but no structured exercise Last colonoscopy: needs Last mammogram: UTD, q36yr Last pap smear: due 2025 Refills needed today: na Immunizations needed: Immunization History  Administered Date(s) Administered   Influenza,inj,Quad PF,6+ Mos 05/18/2015     Past Medical History:  Diagnosis Date   Anxiety    Depression    Social History   Socioeconomic History   Marital status: Married    Spouse name: Not on file   Number of children: Not on file   Years of  education: Not on file   Highest education level: Not on file  Occupational History   Not on file  Tobacco Use   Smoking status: Every Day    Packs/day: 0.50    Years: 28.00    Additional pack years: 0.00    Total pack years: 14.00    Types: Cigarettes    Last attempt to quit: 09/06/2019    Years since quitting: 3.2   Smokeless tobacco: Never  Vaping Use   Vaping Use: Never used  Substance and Sexual Activity   Alcohol use: Yes    Comment: occasionally   Drug use: Yes    Frequency: 1.0 times per week    Types: Marijuana   Sexual activity: Yes    Birth control/protection: Surgical  Other Topics Concern   Not on file  Social History Narrative   Not on file   Social Determinants of Health   Financial Resource Strain: Not on file  Food Insecurity: Not on file  Transportation Needs: Not on file  Physical Activity: Not on file  Stress: Not on file  Social Connections: Not on file  Intimate Partner Violence: Not on file   Past Surgical History:  Procedure Laterality Date   TUBAL LIGATION     Family History  Problem Relation Age of Onset   Hyperlipidemia Mother    Heart disease Mother    Cancer Mother 63       ovarian cancer   Ovarian cancer Mother    Diabetes Father    Drug abuse Brother  death 2/2 OD   Cancer Maternal Grandmother        lung cancer   Alzheimer's disease Maternal Grandfather    Cervical cancer Maternal Aunt    Breast cancer Maternal Aunt 45   No current outpatient medications on file.  No Known Allergies   ROS: Review of Systems Pertinent items noted in HPI and remainder of comprehensive ROS otherwise negative.    Physical exam BP 123/86   Pulse 74   Temp 98.5 F (36.9 C)   Ht 5\' 2"  (1.575 m)   Wt 171 lb 6.4 oz (77.7 kg)   LMP 12/07/2022   SpO2 97%   BMI 31.35 kg/m  General appearance: alert, cooperative, appears stated age, and no distress Head: Normocephalic, without obvious abnormality, atraumatic Eyes: negative  findings: lids and lashes normal, conjunctivae and sclerae normal, corneas clear, and pupils equal, round, reactive to light and accomodation Ears: normal TM's and external ear canals both ears Nose: Nares normal. Septum midline. Mucosa normal. No drainage or sinus tenderness. Throat: lips, mucosa, and tongue normal; teeth and gums normal Neck: no adenopathy, no carotid bruit, supple, symmetrical, trachea midline, and thyroid not enlarged, symmetric, no tenderness/mass/nodules Back: symmetric, no curvature. ROM normal. No CVA tenderness. Lungs: clear to auscultation bilaterally Heart: regular rate and rhythm, S1, S2 normal, no murmur, click, rub or gallop Abdomen: soft, non-tender; bowel sounds normal; no masses,  no organomegaly Extremities: extremities normal, atraumatic, no cyanosis or edema Pulses: 2+ and symmetric Skin: Skin color, texture, turgor normal. No rashes or lesions Lymph nodes: Cervical, supraclavicular, and axillary nodes normal. Neurologic: Grossly normal     12/17/2022   10:41 AM 05/14/2022   12:54 PM 10/03/2020    2:10 PM  Depression screen PHQ 2/9  Decreased Interest 0 0 0  Down, Depressed, Hopeless 0 0 0  PHQ - 2 Score 0 0 0  Altered sleeping 0    Tired, decreased energy 1    Change in appetite 0    Feeling bad or failure about yourself  0    Trouble concentrating 1    Moving slowly or fidgety/restless 0    Suicidal thoughts 0    PHQ-9 Score 2    Difficult doing work/chores Not difficult at all         Assessment/ Plan: Timoteo Ace here for annual physical exam.   Annual physical exam  Bleeding hemorrhoids - Plan: Ambulatory referral to Gastroenterology, CBC  Screen for colon cancer - Plan: Ambulatory referral to Gastroenterology  Gastroesophageal reflux disease without esophagitis - Plan: famotidine (PEPCID) 20 MG tablet, CBC  Vitamin D deficiency - Plan: VITAMIN D 25 Hydroxy (Vit-D Deficiency, Fractures)  Former heavy tobacco smoker - Plan:  CBC  Pure hypercholesterolemia - Plan: CMP14+EGFR, Lipid Panel, TSH  Referral to gastroenterology for colon cancer screening and management of bleeding hemorrhoids.  I offered prescription medication but sounds like she is getting this under control with OTCs it just is a recurrent issue.  We discussed fiber in the diet.  Okay to use famotidine up to twice daily if needed.  We discussed the risk versus benefits of acid reducers.  I do not think that she is needing PPIs at this time and we discussed that really a lot of the risk of falls within that PPI class.  Pepcid has been sent to pharmacy.  Vitamin D checked given discontinuation of oral vitamin D OTC  Check CBC given reports of bleeding hemorrhoids and history of tobacco use.  We  discussed need for lung cancer screening after 45 years old.  She will need it for at least 5 years assuming that she does not resume tobacco use   Check fasting lipid, CMP, TSH given history of LDL in the 160s  Counseled on healthy lifestyle choices, including diet (rich in fruits, vegetables and lean meats and low in salt and simple carbohydrates) and exercise (at least 30 minutes of moderate physical activity daily).  Patient to follow up in 1 year for annual exam or sooner if needed.  Debbrah Sampedro M. Nadine Counts, DO

## 2022-12-18 LAB — CMP14+EGFR
ALT: 14 IU/L (ref 0–32)
AST: 14 IU/L (ref 0–40)
Albumin/Globulin Ratio: 1.8
Albumin: 4.4 g/dL (ref 3.9–4.9)
Alkaline Phosphatase: 67 IU/L (ref 44–121)
BUN/Creatinine Ratio: 14 (ref 9–23)
BUN: 9 mg/dL (ref 6–24)
Bilirubin Total: 0.2 mg/dL (ref 0.0–1.2)
CO2: 22 mmol/L (ref 20–29)
Calcium: 9.8 mg/dL (ref 8.7–10.2)
Chloride: 102 mmol/L (ref 96–106)
Creatinine, Ser: 0.66 mg/dL (ref 0.57–1.00)
Globulin, Total: 2.5 g/dL (ref 1.5–4.5)
Glucose: 89 mg/dL (ref 70–99)
Potassium: 4.1 mmol/L (ref 3.5–5.2)
Sodium: 138 mmol/L (ref 134–144)
Total Protein: 6.9 g/dL (ref 6.0–8.5)
eGFR: 110 mL/min/{1.73_m2} (ref >59)

## 2022-12-18 LAB — TSH: TSH: 2.04 u[IU]/mL (ref 0.450–4.500)

## 2022-12-18 LAB — CBC
Hematocrit: 45.3 % (ref 34.0–46.6)
Hemoglobin: 14.9 g/dL (ref 11.1–15.9)
MCH: 30.4 pg (ref 26.6–33.0)
MCHC: 32.9 g/dL (ref 31.5–35.7)
MCV: 92 fL (ref 79–97)
Platelets: 354 10*3/uL (ref 150–450)
RBC: 4.9 x10E6/uL (ref 3.77–5.28)
RDW: 12.3 % (ref 11.7–15.4)
WBC: 9.5 10*3/uL (ref 3.4–10.8)

## 2022-12-18 LAB — LIPID PANEL
Chol/HDL Ratio: 4.2 ratio (ref 0.0–4.4)
Cholesterol, Total: 236 mg/dL — ABNORMAL HIGH (ref 100–199)
HDL: 56 mg/dL (ref >39)
LDL Chol Calc (NIH): 150 mg/dL — ABNORMAL HIGH (ref 0–99)
Triglycerides: 166 mg/dL — ABNORMAL HIGH (ref 0–149)
VLDL Cholesterol Cal: 30 mg/dL (ref 5–40)

## 2022-12-18 LAB — VITAMIN D 25 HYDROXY (VIT D DEFICIENCY, FRACTURES): Vit D, 25-Hydroxy: 29.5 ng/mL — ABNORMAL LOW (ref 30.0–100.0)

## 2022-12-25 ENCOUNTER — Ambulatory Visit: Payer: Managed Care, Other (non HMO) | Admitting: Gastroenterology

## 2022-12-25 ENCOUNTER — Encounter: Payer: Self-pay | Admitting: Gastroenterology

## 2022-12-25 NOTE — Progress Notes (Deleted)
Referring Provider: Raliegh Ip, DO Primary Care Physician:  Raliegh Ip, DO Primary Gastroenterologist:  Dr. Bonnetta Barry chief complaint on file.   HPI:   Elizabeth [name] is a 47 y.o. female presenting today at the request of Delynn Flavin M, DO for colon cancer screening, bleeding hemorrhoids.  Reviewed recent labs completed 12/17/2022.  Hemoglobin within normal limits at 14.9.  Today:   Past Medical History:  Diagnosis Date   Anxiety    Depression     Past Surgical History:  Procedure Laterality Date   TUBAL LIGATION      Current Outpatient Medications  Medication Sig Dispense Refill   famotidine (PEPCID) 20 MG tablet Take 1 tablet (20 mg total) by mouth 2 (two) times daily as needed for heartburn or indigestion. 180 tablet 3   No current facility-administered medications for this visit.    Allergies as of 12/26/2022   (No Known Allergies)    Family History  Problem Relation Age of Onset   Hyperlipidemia Mother    Heart disease Mother    Cancer Mother 21       ovarian cancer   Ovarian cancer Mother    Diabetes Father    Drug abuse Brother        death 2/2 OD   Cancer Maternal Grandmother        lung cancer   Alzheimer's disease Maternal Grandfather    Cervical cancer Maternal Aunt    Breast cancer Maternal Aunt 84    Social History   Socioeconomic History   Marital status: Married    Spouse name: Not on file   Number of children: Not on file   Years of education: Not on file   Highest education level: Not on file  Occupational History   Not on file  Tobacco Use   Smoking status: Former    Packs/day: 0.90    Years: 30.00    Additional pack years: 0.00    Total pack years: 27.00    Types: Cigarettes    Quit date: 07/29/2022    Years since quitting: 0.4   Smokeless tobacco: Never  Vaping Use   Vaping Use: Never used  Substance and Sexual Activity   Alcohol use: Yes    Comment: occasionally   Drug use: Yes    Frequency:  1.0 times per week    Types: Marijuana   Sexual activity: Yes    Birth control/protection: Surgical  Other Topics Concern   Not on file  Social History Narrative   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  She travels quite a bit for work.   Getting 8k steps per day but no structured exercise regimen.   Social Determinants of Health   Financial Resource Strain: Not on file  Food Insecurity: Not on file  Transportation Needs: Not on file  Physical Activity: Not on file  Stress: Not on file  Social Connections: Not on file  Intimate Partner Violence: Not on file    Review of Systems: Gen: Denies any fever, chills, fatigue, weight loss, lack of appetite.  CV: Denies chest pain, heart palpitations, peripheral edema, syncope.  Resp: Denies shortness of breath at rest or with exertion. Denies wheezing or cough.  GI: Denies dysphagia or odynophagia. Denies jaundice, hematemesis, fecal incontinence. GU : Denies urinary burning, urinary frequency, urinary hesitancy MS: Denies joint pain, muscle weakness, cramps, or limitation of movement.  Derm: Denies rash, itching, dry skin Psych: Denies depression, anxiety, memory loss, and  confusion Heme: Denies bruising, bleeding, and enlarged lymph nodes.  Physical Exam: LMP 12/07/2022  General:   Alert and oriented. Pleasant and cooperative. Well-nourished and well-developed.  Head:  Normocephalic and atraumatic. Eyes:  Without icterus, sclera clear and conjunctiva pink.  Ears:  Normal auditory acuity. Lungs:  Clear to auscultation bilaterally. No wheezes, rales, or rhonchi. No distress.  Heart:  S1, S2 present without murmurs appreciated.  Abdomen:  +BS, soft, non-tender and non-distended. No HSM noted. No guarding or rebound. No masses appreciated.  Rectal:  Deferred  Msk:  Symmetrical without gross deformities. Normal posture. Extremities:  Without edema. Neurologic:  Alert and  oriented x4;  grossly normal neurologically. Skin:   Intact without significant lesions or rashes. Psych:  Alert and cooperative. Normal mood and affect.    Assessment:     Plan:  ***   Ermalinda Memos, PA-C [facility] 12/26/2022

## 2022-12-26 ENCOUNTER — Ambulatory Visit: Payer: Managed Care, Other (non HMO) | Admitting: Gastroenterology

## 2022-12-31 ENCOUNTER — Encounter: Payer: Self-pay | Admitting: Gastroenterology

## 2023-07-14 ENCOUNTER — Ambulatory Visit: Payer: Managed Care, Other (non HMO) | Admitting: Family Medicine

## 2023-07-16 ENCOUNTER — Encounter: Payer: Self-pay | Admitting: Family Medicine

## 2023-07-16 ENCOUNTER — Ambulatory Visit (INDEPENDENT_AMBULATORY_CARE_PROVIDER_SITE_OTHER): Payer: Managed Care, Other (non HMO) | Admitting: Family Medicine

## 2023-07-17 NOTE — Progress Notes (Signed)
 error

## 2023-12-24 ENCOUNTER — Ambulatory Visit (INDEPENDENT_AMBULATORY_CARE_PROVIDER_SITE_OTHER)

## 2023-12-24 ENCOUNTER — Encounter: Payer: Self-pay | Admitting: Family Medicine

## 2023-12-24 ENCOUNTER — Ambulatory Visit: Payer: Managed Care, Other (non HMO) | Admitting: Family Medicine

## 2023-12-24 ENCOUNTER — Other Ambulatory Visit (HOSPITAL_COMMUNITY)
Admission: RE | Admit: 2023-12-24 | Discharge: 2023-12-24 | Disposition: A | Discharge status: Home / Self Care | Source: Ambulatory Visit | Attending: Family Medicine | Admitting: Family Medicine

## 2023-12-24 VITALS — BP 113/78 | HR 69 | Temp 98.5°F | Ht 62.0 in | Wt 162.0 lb

## 2023-12-24 DIAGNOSIS — M25551 Pain in right hip: Secondary | ICD-10-CM | POA: Diagnosis not present

## 2023-12-24 DIAGNOSIS — Z7184 Encounter for health counseling related to travel: Secondary | ICD-10-CM

## 2023-12-24 DIAGNOSIS — Z0001 Encounter for general adult medical examination with abnormal findings: Secondary | ICD-10-CM | POA: Diagnosis not present

## 2023-12-24 DIAGNOSIS — Z87891 Personal history of nicotine dependence: Secondary | ICD-10-CM

## 2023-12-24 DIAGNOSIS — Z124 Encounter for screening for malignant neoplasm of cervix: Secondary | ICD-10-CM | POA: Insufficient documentation

## 2023-12-24 DIAGNOSIS — N939 Abnormal uterine and vaginal bleeding, unspecified: Secondary | ICD-10-CM

## 2023-12-24 DIAGNOSIS — M25552 Pain in left hip: Secondary | ICD-10-CM | POA: Diagnosis not present

## 2023-12-24 DIAGNOSIS — E782 Mixed hyperlipidemia: Secondary | ICD-10-CM

## 2023-12-24 DIAGNOSIS — K219 Gastro-esophageal reflux disease without esophagitis: Secondary | ICD-10-CM

## 2023-12-24 DIAGNOSIS — N644 Mastodynia: Secondary | ICD-10-CM | POA: Diagnosis not present

## 2023-12-24 DIAGNOSIS — E559 Vitamin D deficiency, unspecified: Secondary | ICD-10-CM

## 2023-12-24 DIAGNOSIS — Z1211 Encounter for screening for malignant neoplasm of colon: Secondary | ICD-10-CM

## 2023-12-24 DIAGNOSIS — Z0000 Encounter for general adult medical examination without abnormal findings: Secondary | ICD-10-CM

## 2023-12-24 LAB — LIPID PANEL

## 2023-12-24 MED ORDER — MECLIZINE HCL 25 MG PO TABS: 25.0000 mg | ORAL_TABLET | Freq: Three times a day (TID) | ORAL | 0 refills | Status: AC | PRN

## 2023-12-24 MED ORDER — FAMOTIDINE 20 MG PO TABS
20.0000 mg | ORAL_TABLET | Freq: Two times a day (BID) | ORAL | 3 refills | Status: AC | PRN
Start: 2023-12-24 — End: ?

## 2023-12-24 NOTE — Patient Instructions (Addendum)
 You should receive a call re: mammogram at [facility]. Make sure to skip deodorant that day.  Stool DNA Testing for Colon Cancer: What to Know Why am I having this test? Colon cancer is one of the most common types of cancer. And it's a leading cause of cancer-related death. Getting tested for colon cancer before symptoms develop, also called screening, can help to find the cancer early. This leads to a better chance for treatment that works. All adults should have colon cancer screening starting at age 53 and continuing until age 50. After age 14, you may no longer need screening based on your health. Your health care provider may also recommend screening before age 72. You will have tests every 1-10 years, depending on your results and the type of screening test. Screening may include having stool DNA testing every 3 years if: You have no symptoms of colon cancer. Symptoms include rectal bleeding, unexplained weight loss, and changes in bowel habits. You have an average risk of colon cancer. Average risk means: You don't have precancerous polyps. These are abnormal growths that can cause cancer. You don't have a family or personal history of either colon cancer or a colon disease that increases your chances of getting colon cancer. You don't have a personal history of other types of cancer. You don't have diabetes. This test may be done more often for people at high risk. What is being tested? Stool DNA testing, also called the fecal immunochemical DNA test (FIT-DNA test), is one method of screening for colon cancer. For this test, a sample of your poop (stool) is checked for blood and changes in DNA that could lead to cancer. Growths in the colon bleed and shed cells if they could lead to cancer. This test looks for blood cells and checks for nine types of DNA from three genes linked with colon cancer and precancerous polyps. What kind of sample is taken?  A poop sample is needed for this  test. You'll be asked to collect the sample at home. How do I collect samples at home? You'll be sent a sample collection kit in the mail. The kit will include instructions and everything you need to get the sample. To collect a poop sample at home, follow the steps in the kit. Return the sample to the lab as told. The sample will be checked within about 3 days of when the lab receives it. The results will be sent to your provider. How do I prepare for this test? There's no preparation needed for this test. But, do not collect a poop sample if: You have bleeding hemorrhoids. These are swollen veins in and around the rectum or the opening of the butt. You have any rectal bleeding. You have a cut on your hand or finger. You have your menstrual period. You have diarrhea. How are the results reported? Your test results will be reported as either positive or negative. What do the results mean? A positive result means that the test found abnormal DNA or blood cells, or both. If you have a positive result, you'll need to have a follow-up exam of your colon done with a scope. A negative result means that no blood or changes in DNA were found. This doesn't guarantee that you don't have colon cancer. Your provider may recommend that you have other screening tests. Talk with your provider about what your results mean. More tests may be needed. Questions to ask your health care provider Ask your provider, or the  department that's doing the test: When will my results be ready? How will I get my results? What are my treatment options? What other tests do I need? What are my next steps? This information is not intended to replace advice given to you by your health care provider. Make sure you discuss any questions you have with your health care provider. Document Revised: 04/23/2023 Document Reviewed: 04/23/2023 Elsevier Patient Education  2025 [employer].

## 2023-12-24 NOTE — Progress Notes (Signed)
 Elizabeth [name] is a 48 y.o. female presents to office today for annual physical exam examination.    Concerns today include: 1.  Left breast pain She reports that she is been having some intermittent sharp left breast pain and this has been going on for a while.  She had imaging done back in 2022 at [facility] and she had a BI-RADS 3 but they did recommend a 21-month mammogram just ensure stability from that baseline examination given family history of breast cancer.  Sadly, she was lost to follow-up.  She reports no unplanned weight loss, night sweats, nipple discharge or skin changes.  No palpable lumps in the breast.  She is sexually active and does report some bilateral hip pain with certain positions but otherwise does not report any pelvic pain, abnormal vaginal bleeding or discharge.  She denies any radiation of the pain into the groin.  Pain is alleviated with rest.  She does not have it with ambulation.  Reports no falls or instability.    She wants to note is that she did have a close her menstrual cycle recently where she went about 20 to 24 days between menstrual cycles rather than typical 28.  Marital status: Married.  Going to the [place] further 25th wedding anniversary, Substance use: Former smoker, social alcohol.  No drug use Health Maintenance Due  Topic Date Due   DTaP/Tdap/Td (1 - Tdap) Never done   MAMMOGRAM  11/29/2020   Colonoscopy  Never done   Refills needed today: Pepcid   Immunization History  Administered Date(s) Administered   Influenza,inj,Quad PF,6+ Mos 05/18/2015   Past Medical History:  Diagnosis Date   Anxiety    Depression    GERD (gastroesophageal reflux disease)    Social History   Socioeconomic History   Marital status: Married    Spouse name: Not on file   Number of children: Not on file   Years of education: Not on file   Highest education level: Not on file  Occupational History   Not on file  Tobacco Use   Smoking status: Former     Current packs/day: 0.00    Average packs/day: 0.9 packs/day for 30.0 years (27.0 ttl pk-yrs)    Types: Cigarettes    Start date: 07/29/1992    Quit date: 07/29/2022    Years since quitting: 1.4   Smokeless tobacco: Never  Vaping Use   Vaping status: Never Used  Substance and Sexual Activity   Alcohol use: Yes    Comment: occasionally   Drug use: Yes    Frequency: 1.0 times per week    Types: Marijuana   Sexual activity: Yes    Birth control/protection: Surgical  Other Topics Concern   Not on file  Social History Narrative   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  She travels quite a bit for work.   Getting 8k steps per day but no structured exercise regimen.   Social Drivers of Corporate investment banker Strain: Not on file  Food Insecurity: Not on file  Transportation Needs: Not on file  Physical Activity: Not on file  Stress: Not on file  Social Connections: Unknown (11/20/2021)   Received [facility]   Social Network    Social Network: Not on file  Intimate Partner Violence: Unknown (10/12/2021)   Received from [facility]   HITS    Physically Hurt: Not on file    Insult or Talk Down To: Not on file  Threaten Physical Harm: Not on file    Scream or Curse: Not on file   Past Surgical History:  Procedure Laterality Date   TUBAL LIGATION     Family History  Problem Relation Age of Onset   Hyperlipidemia Mother    Heart disease Mother    Cancer Mother 56       ovarian cancer   Ovarian cancer Mother    Diabetes Father    Drug abuse Brother        death 2/2 OD   Cancer Maternal Grandmother        lung cancer   Alzheimer's disease Maternal Grandfather    Cervical cancer Maternal Aunt    Breast cancer Maternal Aunt 45    Current Outpatient Medications:    famotidine  (PEPCID ) 20 MG tablet, Take 1 tablet (20 mg total) by mouth 2 (two) times daily as needed for heartburn or indigestion., Disp: 180 tablet, Rfl: 3  No Known Allergies    ROS: Review of Systems Pertinent items noted in HPI and remainder of comprehensive ROS otherwise negative.    Physical exam BP 113/78   Pulse 69   Temp 98.5 F (36.9 C)   Ht 5' 2 (1.575 m)   Wt 162 lb (73.5 kg)   LMP 12/12/2023   SpO2 97%   BMI 29.63 kg/m  General appearance: alert, cooperative, appears stated age, and no distress Head: Normocephalic, without obvious abnormality, atraumatic Eyes: negative findings: lids and lashes normal, conjunctivae and sclerae normal, corneas clear, and pupils equal, round, reactive to light and accomodation Ears: normal TM's and external ear canals both ears Nose: Nares normal. Septum midline. Mucosa normal. No drainage or sinus tenderness. Throat: lips, mucosa, and tongue normal; teeth and gums normal Neck: no adenopathy, supple, symmetrical, trachea midline, and thyroid  not enlarged, symmetric, no tenderness/mass/nodules Back: symmetric, no curvature. ROM normal. No CVA tenderness. Lungs: clear to auscultation bilaterally Breasts: No nipple retraction or dimpling, No nipple discharge or bleeding, No axillary or supraclavicular adenopathy, Normal to palpation without dominant masses, few well-circumscribed, rubbery fibrocystic changes noted throughout bilateral breasts Heart: regular rate and rhythm, S1, S2 normal, no murmur, click, rub or gallop Abdomen: soft, non-tender; bowel sounds normal; no masses,  no organomegaly Pelvic: cervix normal in appearance, external genitalia normal, no adnexal masses or tenderness, no cervical motion tenderness, rectovaginal septum normal, uterus normal size, shape, and consistency, and cervix is to patient's anatomic right.  She has mild to moderate leukorrhea Extremities: extremities normal, atraumatic, no cyanosis or edema Pulses: 2+ and symmetric Skin: Skin color, texture, turgor normal. No rashes or lesions Lymph nodes: Cervical, supraclavicular, and axillary nodes normal. Neurologic: Grossly normal       12/24/2023    2:03 PM 12/17/2022   10:41 AM 05/14/2022   12:54 PM  Depression screen PHQ 2/9  Decreased Interest 0 0 0  Down, Depressed, Hopeless 0 0 0  PHQ - 2 Score 0 0 0  Altered sleeping 1 0   Tired, decreased energy 1 1   Change in appetite 1 0   Feeling bad or failure about yourself  0 0   Trouble concentrating 1 1   Moving slowly or fidgety/restless 0 0   Suicidal thoughts 0 0   PHQ-9 Score 4 2   Difficult doing work/chores  Not difficult at all       12/24/2023    2:03 PM 12/17/2022   10:40 AM 03/18/2017    8:29 AM  GAD 7 : Generalized Anxiety  Score  Nervous, Anxious, on Edge 3 2 3   Control/stop worrying 2 2 2   Worry too much - different things 2 2 2   Trouble relaxing 1 1 2   Restless 1 1 2   Easily annoyed or irritable 3 1 3   Afraid - awful might happen 3 2 2   Total GAD 7 Score 15 11 16   Anxiety Difficulty Somewhat difficult Somewhat difficult Somewhat difficult     Assessment/ Plan: Guilford Leep here for annual physical exam.   Annual physical exam  Screening for malignant neoplasm of cervix - Plan: Cytology - PAP  Abnormal uterine bleeding (AUB) - Plan: TSH + free T4  Breast pain, left - Plan: MM 3D DIAGNOSTIC MAMMOGRAM BILATERAL BREAST, US  BREAST COMPLETE UNI LEFT INC AXILLA  Bilateral hip pain - Plan: DG HIPS BILAT WITH PELVIS 2V  Travel advice encounter - Plan: meclizine (ANTIVERT) 25 MG tablet  Screen for colon cancer - Plan: Cologuard  Vitamin D  deficiency - Plan: VITAMIN D  25 Hydroxy (Vit-D Deficiency, Fractures)  Former smoker - Plan: CBC  Mixed hyperlipidemia - Plan: CMP14+EGFR, Lipid Panel, CANCELED: TSH  Gastroesophageal reflux disease without esophagitis - Plan: famotidine  (PEPCID ) 20 MG tablet  Pap smear updated today.  Last Pap was normal 3 years ago  Check thyroid  levels given abnormal uterine bleeding but has had suspect that she is perimenopausal.  Her pelvic exam was unremarkable today  ASAP diagnostic mammogram and  ultrasound given left breast pain and family history of breast cancers.  Her physical exam was remarkable for some fibrocystic changes but no dominant masses or other palpable abnormalities  Bilateral hip x-ray.  She did not have any findings to suggest that she was having trochanteric bursitis but I do question possible impingement given pain with FABERE.  Antivert ordered for as needed use if needed for motion sickness on her travels to [place]  Cologuard ordered for colon cancer screening.  No apparent contraindication to utilization of this modality of treatment  Check vitamin D , CBC, lipid panel and CMP given history of deficiency, former smoking and hyperlipidemia  Pepcid  renewed for as needed use  Counseled on healthy lifestyle choices, including diet (rich in fruits, vegetables and lean meats and low in salt and simple carbohydrates) and exercise (at least 30 minutes of moderate physical activity daily).  Patient to follow up 1 year for CPE  Jamar Casagrande M. Bonnell Butcher, DO

## 2023-12-25 LAB — CMP14+EGFR
ALT: 14 IU/L (ref 0–32)
AST: 22 IU/L (ref 0–40)
Albumin: 4.6 g/dL (ref 3.9–4.9)
Alkaline Phosphatase: 71 IU/L (ref 44–121)
BUN/Creatinine Ratio: 12 (ref 9–23)
BUN: 8 mg/dL (ref 6–24)
Bilirubin Total: 0.6 mg/dL (ref 0.0–1.2)
CO2: 21 mmol/L (ref 20–29)
Calcium: 9.8 mg/dL (ref 8.7–10.2)
Chloride: 98 mmol/L (ref 96–106)
Creatinine, Ser: 0.65 mg/dL (ref 0.57–1.00)
Globulin, Total: 2.5 g/dL (ref 1.5–4.5)
Glucose: 93 mg/dL (ref 70–99)
Potassium: 4.4 mmol/L (ref 3.5–5.2)
Sodium: 138 mmol/L (ref 134–144)
Total Protein: 7.1 g/dL (ref 6.0–8.5)
eGFR: 110 mL/min/{1.73_m2} (ref >59)

## 2023-12-25 LAB — CBC
Hematocrit: 44.9 % (ref 34.0–46.6)
Hemoglobin: 14.6 g/dL (ref 11.1–15.9)
MCH: 30.2 pg (ref 26.6–33.0)
MCHC: 32.5 g/dL (ref 31.5–35.7)
MCV: 93 fL (ref 79–97)
Platelets: 389 10*3/uL (ref 150–450)
RBC: 4.84 x10E6/uL (ref 3.77–5.28)
RDW: 12.5 % (ref 11.7–15.4)
WBC: 10 10*3/uL (ref 3.4–10.8)

## 2023-12-25 LAB — TSH+FREE T4
Free T4: 1.1 ng/dL (ref 0.82–1.77)
TSH: 2.12 u[IU]/mL (ref 0.450–4.500)

## 2023-12-25 LAB — LIPID PANEL
Chol/HDL Ratio: 4.7 ratio — ABNORMAL HIGH (ref 0.0–4.4)
Cholesterol, Total: 249 mg/dL — ABNORMAL HIGH (ref 100–199)
HDL: 53 mg/dL (ref >39)
LDL Chol Calc (NIH): 154 mg/dL — ABNORMAL HIGH (ref 0–99)
Triglycerides: 229 mg/dL — ABNORMAL HIGH (ref 0–149)
VLDL Cholesterol Cal: 42 mg/dL — ABNORMAL HIGH (ref 5–40)

## 2023-12-25 LAB — VITAMIN D 25 HYDROXY (VIT D DEFICIENCY, FRACTURES): Vit D, 25-Hydroxy: 28.7 ng/mL — ABNORMAL LOW (ref 30.0–100.0)

## 2023-12-26 ENCOUNTER — Other Ambulatory Visit: Payer: Self-pay | Admitting: Family Medicine

## 2023-12-26 ENCOUNTER — Ambulatory Visit: Payer: Self-pay | Admitting: Family Medicine

## 2023-12-26 DIAGNOSIS — E782 Mixed hyperlipidemia: Secondary | ICD-10-CM

## 2023-12-26 DIAGNOSIS — Z87891 Personal history of nicotine dependence: Secondary | ICD-10-CM

## 2023-12-26 NOTE — Addendum Note (Signed)
 Addended by: Eliodoro Guerin on: 12/26/2023 11:28 AM   Modules accepted: Orders

## 2023-12-29 LAB — CYTOLOGY - PAP
Adequacy: ABSENT
Comment: NEGATIVE
Diagnosis: NEGATIVE
High risk HPV: NEGATIVE

## 2024-01-06 ENCOUNTER — Ambulatory Visit
Admission: RE | Admit: 2024-01-06 | Discharge: 2024-01-06 | Disposition: A | Discharge status: Home / Self Care | Source: Ambulatory Visit | Attending: Family Medicine | Admitting: Family Medicine

## 2024-01-06 ENCOUNTER — Ambulatory Visit
Admission: RE | Admit: 2024-01-06 | Discharge: 2024-01-06 | Disposition: A | Discharge status: Home / Self Care | Source: Ambulatory Visit | Attending: Family Medicine

## 2024-01-06 DIAGNOSIS — N644 Mastodynia: Secondary | ICD-10-CM

## 2024-01-14 ENCOUNTER — Other Ambulatory Visit (HOSPITAL_BASED_OUTPATIENT_CLINIC_OR_DEPARTMENT_OTHER)

## 2024-01-27 ENCOUNTER — Ambulatory Visit (HOSPITAL_BASED_OUTPATIENT_CLINIC_OR_DEPARTMENT_OTHER)

## 2024-12-29 ENCOUNTER — Encounter: Payer: Self-pay | Admitting: Family Medicine
